# Patient Record
Sex: Male | Born: 1950 | Race: White | Hispanic: No | Marital: Married | Smoking: Former smoker
Health system: Southern US, Community
[De-identification: ages and names within clinical notes are randomized; demographics above are authoritative.]

## PROBLEM LIST (undated history)

## (undated) DIAGNOSIS — K5792 Diverticulitis of intestine, part unspecified, without perforation or abscess without bleeding: Secondary | ICD-10-CM

## (undated) DIAGNOSIS — E119 Type 2 diabetes mellitus without complications: Secondary | ICD-10-CM

## (undated) DIAGNOSIS — N400 Enlarged prostate without lower urinary tract symptoms: Secondary | ICD-10-CM

## (undated) DIAGNOSIS — S42401A Unspecified fracture of lower end of right humerus, initial encounter for closed fracture: Secondary | ICD-10-CM

## (undated) DIAGNOSIS — K5790 Diverticulosis of intestine, part unspecified, without perforation or abscess without bleeding: Secondary | ICD-10-CM

## (undated) DIAGNOSIS — Z5189 Encounter for other specified aftercare: Secondary | ICD-10-CM

## (undated) DIAGNOSIS — K635 Polyp of colon: Secondary | ICD-10-CM

## (undated) DIAGNOSIS — IMO0002 Reserved for concepts with insufficient information to code with codable children: Secondary | ICD-10-CM

## (undated) HISTORY — DX: Polyp of colon: K63.5

## (undated) HISTORY — DX: Encounter for other specified aftercare: Z51.89

## (undated) HISTORY — DX: Diverticulitis of intestine, part unspecified, without perforation or abscess without bleeding: K57.92

## (undated) HISTORY — DX: Reserved for concepts with insufficient information to code with codable children: IMO0002

## (undated) HISTORY — DX: Unspecified fracture of lower end of right humerus, initial encounter for closed fracture: S42.401A

## (undated) HISTORY — DX: Benign prostatic hyperplasia without lower urinary tract symptoms: N40.0

## (undated) HISTORY — DX: Diverticulosis of intestine, part unspecified, without perforation or abscess without bleeding: K57.90

---

## 1995-05-08 DIAGNOSIS — IMO0001 Reserved for inherently not codable concepts without codable children: Secondary | ICD-10-CM

## 1995-05-08 DIAGNOSIS — Z5189 Encounter for other specified aftercare: Secondary | ICD-10-CM

## 1995-05-08 DIAGNOSIS — IMO0002 Reserved for concepts with insufficient information to code with codable children: Secondary | ICD-10-CM

## 1995-05-08 HISTORY — DX: Reserved for concepts with insufficient information to code with codable children: IMO0002

## 1995-05-08 HISTORY — DX: Encounter for other specified aftercare: Z51.89

## 1995-05-08 HISTORY — DX: Reserved for inherently not codable concepts without codable children: IMO0001

## 1998-05-09 ENCOUNTER — Other Ambulatory Visit: Admission: RE | Admit: 1998-05-09 | Discharge: 1998-05-09 | Payer: Self-pay | Admitting: Otolaryngology

## 2011-06-08 DIAGNOSIS — K635 Polyp of colon: Secondary | ICD-10-CM

## 2011-06-08 HISTORY — DX: Polyp of colon: K63.5

## 2011-06-18 ENCOUNTER — Ambulatory Visit (AMBULATORY_SURGERY_CENTER): Payer: BC Managed Care – PPO | Admitting: *Deleted

## 2011-06-18 VITALS — Ht 69.0 in | Wt 210.0 lb

## 2011-06-18 DIAGNOSIS — Z1211 Encounter for screening for malignant neoplasm of colon: Secondary | ICD-10-CM

## 2011-06-18 MED ORDER — PEG-KCL-NACL-NASULF-NA ASC-C 100 G PO SOLR: ORAL | Status: DC

## 2011-06-28 ENCOUNTER — Encounter: Payer: Self-pay | Admitting: Gastroenterology

## 2011-06-28 ENCOUNTER — Ambulatory Visit (AMBULATORY_SURGERY_CENTER): Payer: BC Managed Care – PPO | Admitting: Gastroenterology

## 2011-06-28 DIAGNOSIS — Z1211 Encounter for screening for malignant neoplasm of colon: Secondary | ICD-10-CM

## 2011-06-28 DIAGNOSIS — D126 Benign neoplasm of colon, unspecified: Secondary | ICD-10-CM

## 2011-06-28 DIAGNOSIS — K5792 Diverticulitis of intestine, part unspecified, without perforation or abscess without bleeding: Secondary | ICD-10-CM

## 2011-06-28 MED ORDER — SODIUM CHLORIDE 0.9 % IV SOLN: 500.0000 mL | INTRAVENOUS | Status: DC

## 2011-06-28 MED ORDER — CIPROFLOXACIN HCL 500 MG PO TABS: 500.0000 mg | ORAL_TABLET | Freq: Two times a day (BID) | ORAL | Status: AC

## 2011-06-28 NOTE — Patient Instructions (Signed)
Per Dr. Russella Dar hold aspirin, aspirin products and anti-inflammatory for 2 weeks until July 12, 2011.  You may resume your other medications other than what was just mentioned.  There was some diverticulitis still noted so Dr. Russella Dar has ordered CIPRO 500mg  #14 take one 2 x per day for 7 days.  The RX was sent to [facility] on [address].  Please call if any questions or concerns. Handouts were given to your care partner on polyps, diverticulosis and high fiber diet with liberal fluid intake.  YOU HAD AN ENDOSCOPIC PROCEDURE TODAY AT THE [facility]: Refer to the procedure report that was given to you for any specific questions about what was found during the examination.  If the procedure report does not answer your questions, please call your gastroenterologist to clarify.  If you requested that your care partner not be given the details of your procedure findings, then the procedure report has been included in a sealed envelope for you to review at your convenience later.  YOU SHOULD EXPECT: Some feelings of bloating in the abdomen. Passage of more gas than usual.  Walking can help get rid of the air that was put into your GI tract during the procedure and reduce the bloating. If you had a lower endoscopy (such as a colonoscopy or flexible sigmoidoscopy) you may notice spotting of blood in your stool or on the toilet paper. If you underwent a bowel prep for your procedure, then you may not have a normal bowel movement for a few days.  DIET: Your first meal following the procedure should be a light meal and then it is ok to progress to your normal diet.  A half-sandwich or bowl of soup is an example of a good first meal.  Heavy or fried foods are harder to digest and may make you feel nauseous or bloated.  Likewise meals heavy in dairy and vegetables can cause extra gas to form and this can also increase the bloating.  Drink plenty of fluids but you should avoid alcoholic beverages for 24  hours.  ACTIVITY: Your care partner should take you home directly after the procedure.  You should plan to take it easy, moving slowly for the rest of the day.  You can resume normal activity the day after the procedure however you should NOT DRIVE or use heavy machinery for 24 hours (because of the sedation medicines used during the test).    SYMPTOMS TO REPORT IMMEDIATELY: A gastroenterologist can be reached at any hour.  During normal business hours, 8:30 AM to 5:00 PM Monday through Friday, call [phone].  After hours and on weekends, please call the GI answering service at [phone] who will take a message and have the physician on call contact you.   Following lower endoscopy (colonoscopy or flexible sigmoidoscopy):  Excessive amounts of blood in the stool  Significant tenderness or worsening of abdominal pains  Swelling of the abdomen that is new, acute  Fever of 100F or higher  Following upper endoscopy (EGD)  Vomiting of blood or coffee ground material  New chest pain or pain under the shoulder blades  Painful or persistently difficult swallowing  New shortness of breath  Fever of 100F or higher  Black, tarry-looking stools  FOLLOW UP: If any biopsies were taken you will be contacted by phone or by letter within the next 1-3 weeks.  Call your gastroenterologist if you have not heard about the biopsies in 3 weeks.  Our staff  will call the home number listed on your records the next business day following your procedure to check on you and address any questions or concerns that you may have at that time regarding the information given to you following your procedure. This is a courtesy call and so if there is no answer at the home number and we have not heard from you through the emergency physician on call, we will assume that you have returned to your regular daily activities without incident.  SIGNATURES/CONFIDENTIALITY: You and/or your care partner have signed  paperwork which will be entered into your electronic medical record.  These signatures attest to the fact that that the information above on your After Visit Summary has been reviewed and is understood.  Full responsibility of the confidentiality of this discharge information lies with you and/or your care-partner.

## 2011-06-28 NOTE — Progress Notes (Signed)
No complaints noted in the recovery room. Maw  Patient did not experience any of the following events: a burn prior to discharge; a fall within the facility; wrong site/side/patient/procedure/implant event; or a hospital transfer or hospital admission upon discharge from the facility. (G8907) Patient did not have preoperative order for IV antibiotic SSI prophylaxis. (G8918)  

## 2011-06-28 NOTE — Op Note (Signed)
[facility] [address] [place], [place]  [address]  COLONOSCOPY PROCEDURE REPORT  PATIENT:  Russell Soto, Russell Soto  MR#:  [id number] BIRTHDATE:  1950-11-21, 60 yrs. old  GENDER:  male ENDOSCOPIST:  Judie Petit T. Russella Dar, MD, [facility]  PROCEDURE DATE:  06/28/2011 PROCEDURE:  Colonoscopy with biopsy and snare polypectomy ASA CLASS:  Class II INDICATIONS:  1) Routine Risk Screening MEDICATIONS:   These medications were titrated to patient response per physician's verbal order, Fentanyl 100 mcg IV, Versed 10 mg IV DESCRIPTION OF PROCEDURE:   After the risks benefits and alternatives of the procedure were thoroughly explained, informed consent was obtained.  Digital rectal exam was performed and revealed no abnormalities.   The LB 180AL [id number] endoscope was introduced through the anus and advanced to the cecum, which was identified by both the appendix and ileocecal valve, without limitations.  The quality of the prep was good, using MoviPrep. The instrument was then slowly withdrawn as the colon was fully examined. <<PROCEDUREIMAGES>> FINDINGS:  A sessile polyp was found in the ascending colon. It was 4 mm in size. The polyp was removed using cold biopsy forceps. Seven polyps were found in the transverse colon. They were 5 - 7 mm in size. Polyps were snared without cautery. Retrieval was successful. A sessile polyp was found in the mid transverse colon. It was 4 mm in size. The polyp was removed using cold biopsy forceps.  Two polyps were found in the descending colon. They were 5 - 6 mm in size. Polyps were snared without cautery. Retrieval was successful. Moderate diverticulosis was found in the sigmoid to descending colon. Diverticulitis was found in the sigmoid colon. Otherwise normal colonoscopy without other polyps, masses, vascular ectasias, or inflammatory changes. Retroflexed views in the rectum revealed no abnormalities. The time to cecum =  1.25 minutes. The scope was then  withdrawn (time =  18.5  min) from the patient and the procedure completed.  COMPLICATIONS:  None  ENDOSCOPIC IMPRESSION: 1) 4 mm sessile polyp in the ascending colon 2) 5 - 7 mm Five polyps in the transverse colon 3) 4 mm sessile polyp in the mid transverse colon 4) 5 - 6 mm Two polyps in the descending colon 5) Moderate diverticulosis in the sigmoid to descending colon 6) Diverticulitis in the sigmoid colon  RECOMMENDATIONS: 1) Hold aspirin, aspirin products, and anti-inflammatory medication for 2 weeks. 2) Await pathology results 3) High fiber diet with liberal fluid intake. 4) Cipro 500 mg po bid, #14 5) Repeat Colonoscopy in 3 years if 3 or more polyps are adenomatous, 5 years if 1-2 are adenomatous, otherwise 10 years.  Venita Lick. Russella Dar, MD, [facility]  n. eSIGNED:   Venita Lick. Shaleka Brines at 06/28/2011 02:48 PM  Jettie Pagan, [phone]

## 2011-06-29 ENCOUNTER — Telehealth: Payer: Self-pay | Admitting: *Deleted

## 2011-06-29 NOTE — Telephone Encounter (Signed)
Left message on number given in admitting yesterday. ewm 

## 2011-07-03 ENCOUNTER — Encounter: Payer: Self-pay | Admitting: Gastroenterology

## 2012-03-24 ENCOUNTER — Telehealth: Payer: Self-pay | Admitting: Gastroenterology

## 2012-03-24 ENCOUNTER — Ambulatory Visit (INDEPENDENT_AMBULATORY_CARE_PROVIDER_SITE_OTHER): Payer: BC Managed Care – PPO | Admitting: Physician Assistant

## 2012-03-24 ENCOUNTER — Encounter: Payer: Self-pay | Admitting: *Deleted

## 2012-03-24 VITALS — BP 142/82 | HR 85 | Ht 68.0 in | Wt 214.4 lb

## 2012-03-24 DIAGNOSIS — K5790 Diverticulosis of intestine, part unspecified, without perforation or abscess without bleeding: Secondary | ICD-10-CM

## 2012-03-24 DIAGNOSIS — Z86010 Personal history of colonic polyps: Secondary | ICD-10-CM | POA: Insufficient documentation

## 2012-03-24 DIAGNOSIS — N400 Enlarged prostate without lower urinary tract symptoms: Secondary | ICD-10-CM

## 2012-03-24 DIAGNOSIS — Z860101 Personal history of adenomatous and serrated colon polyps: Secondary | ICD-10-CM

## 2012-03-24 DIAGNOSIS — K5730 Diverticulosis of large intestine without perforation or abscess without bleeding: Secondary | ICD-10-CM

## 2012-03-24 MED ORDER — METRONIDAZOLE 500 MG PO TABS: 500.0000 mg | ORAL_TABLET | Freq: Two times a day (BID) | ORAL | Status: AC

## 2012-03-24 MED ORDER — CIPROFLOXACIN HCL 500 MG PO TABS: 500.0000 mg | ORAL_TABLET | Freq: Two times a day (BID) | ORAL | Status: DC

## 2012-03-24 MED ORDER — CIPROFLOXACIN HCL 500 MG PO TABS: 500.0000 mg | ORAL_TABLET | Freq: Two times a day (BID) | ORAL | Status: AC

## 2012-03-24 MED ORDER — METRONIDAZOLE 500 MG PO TABS: 500.0000 mg | ORAL_TABLET | Freq: Two times a day (BID) | ORAL | Status: DC

## 2012-03-24 NOTE — Telephone Encounter (Signed)
Patient c/o LLQ pain that started last night.  It woke him up in the night.  He is very tender to touch this am on the LLQ.  He tried a heating pad and tylenol with no improvement.  He had a colon 06/2011 and was treated at the time for diverticulitis.  He will come in today and see Amy Esterwood PA at 2:00

## 2012-03-24 NOTE — Patient Instructions (Addendum)
Eat small frequent soft foods. Call if symptoms have not resolved or if they worsen at any point. We sent prescriptions to [facility] Dr for Cipro and Metronidazole, antibiotics. Take Extra strength Tylenol as needed for pain.

## 2012-03-24 NOTE — Progress Notes (Signed)
Subjective:    Patient ID: Russell Soto, male    DOB: [birth year], 60 y.o.   MRN: [id number]  HPI Russell Soto is a pleasant 61 year old male known to Dr. Russella Dar who has history of adenomatous colon polyps and diverticular disease. He last had colonoscopy in February of 2013 and was found to have active diverticulitis sigmoid colon, and diverticulosis of the sigmoid and descending colon. He also had multiple polyps with 9 polyps removed in total. The largest of these was 5-7 mm in the transverse colon, and 3 of the polyps were tubular adenomas negative for high-grade dysplasia. He was to a course of Cipro after the colonoscopy and is planned for a followup colon and a 3 year interval.  He states he has done well since that time, and has been trying to watch his diet, avoid nuts etc. He had did eat a lot of popcorn this past Friday evening and then woke up early this morning with left-sided abdominal pain which woke him from sleep.  He says the pain has been constant and he is uncomfortable with walking. He has not had any changes in his bowel habits, melena or hematochezia. No nausea vomiting fever or chills. He says he just does not feel well and that is unusual for him. He denies any dysuria, frequency etc.    Review of Systems  Constitutional: Positive for fatigue.  HENT: Negative.   Eyes: Negative.   Respiratory: Negative.   Cardiovascular: Negative.   Gastrointestinal: Positive for abdominal pain.  Genitourinary: Negative.   Musculoskeletal: Negative.   Neurological: Negative.   Hematological: Negative.   Psychiatric/Behavioral: Negative.    Outpatient Prescriptions Prior to Visit  Medication Sig Dispense Refill  . Ascorbic Acid (VITAMIN C) 500 MG CAPS Take 1 capsule by mouth daily.      Marland Kitchen aspirin 325 MG EC tablet Take 325 mg by mouth daily.      . fish oil-omega-3 fatty acids 1000 MG capsule Take 1 g by mouth daily.      . Multiple Vitamins-Minerals (MULTIVITAMIN WITH MINERALS) tablet  Take 1 tablet by mouth daily.      . Tamsulosin HCl (FLOMAX) 0.4 MG CAPS Take 1 capsule by mouth daily after supper.          No Known Allergies Patient Active Problem List  Diagnosis  . Diverticulosis  . Hx of adenomatous colonic polyps  . BPH (benign prostatic hyperplasia)   History  Substance Use Topics  . Smoking status: Former Smoker    Quit date: 12/20/1994  . Smokeless tobacco: Never Used  . Alcohol Use: No    Objective:   Physical Exam well-developed older white male in no acute distress, pleasant blood pressure 142/82 pulse 85 height 5 foot 8 weight 214. HEENT; nontraumatic normocephalic EOMI PERRLA sclera anicteric, Neck;supple no JVD, Cardiovascular; regular rate and rhythm with S1-S2 no murmur or gallop, Pulmonary; clear bilaterally, Abdomen ;soft, bowel sounds are present he is tender in the left mid quadrant and left lower quadrant no guarding or rebound, no palpable mass or hepatosplenomegaly, Rectal; exam not done, Extremities; no clubbing cyanosis or edema skin warm and dry, Psych; mood and affect normal and appropriate.        Assessment & Plan:  #18 61 year old male with recurrent acute diverticulitis. #2 multiple adenomatous polyps, as well as hyperplastic polyps found on colonoscopy February 2013. He is to have a followup in 3 years  Plan; start Cipro 500 mg by mouth twice daily x14 days Start Flagyl  500 mg by mouth twice daily x14 days with food Offered an analgesic whichhe declines-will use Tylenol as needed He is advised pushing fluids and eat a soft bland diet over the next few days with gradual advancement. He will call if his pain or other symptoms worsen at any point over the next week, And also knows to call if his pain has not completely resolved when he finishes the antibiotics. Continue avoidance of popcorn and nuts.

## 2012-03-24 NOTE — Progress Notes (Signed)
Reviewed and agree with management plan. Kashmere Daywalt T. Geralynn Capri MD FACG 

## 2012-06-20 ENCOUNTER — Emergency Department (HOSPITAL_COMMUNITY)
Admission: EM | Admit: 2012-06-20 | Discharge: 2012-06-20 | Disposition: A | Discharge status: Home / Self Care | Payer: BC Managed Care – PPO | Attending: Emergency Medicine | Admitting: Emergency Medicine

## 2012-06-20 ENCOUNTER — Emergency Department (HOSPITAL_COMMUNITY): Payer: BC Managed Care – PPO

## 2012-06-20 ENCOUNTER — Encounter (HOSPITAL_COMMUNITY): Payer: Self-pay | Admitting: Emergency Medicine

## 2012-06-20 DIAGNOSIS — Z7982 Long term (current) use of aspirin: Secondary | ICD-10-CM | POA: Insufficient documentation

## 2012-06-20 DIAGNOSIS — Z860100 Personal history of colon polyps, unspecified: Secondary | ICD-10-CM | POA: Insufficient documentation

## 2012-06-20 DIAGNOSIS — S0181XA Laceration without foreign body of other part of head, initial encounter: Secondary | ICD-10-CM

## 2012-06-20 DIAGNOSIS — Y9389 Activity, other specified: Secondary | ICD-10-CM | POA: Insufficient documentation

## 2012-06-20 DIAGNOSIS — Z87891 Personal history of nicotine dependence: Secondary | ICD-10-CM | POA: Insufficient documentation

## 2012-06-20 DIAGNOSIS — Z8719 Personal history of other diseases of the digestive system: Secondary | ICD-10-CM | POA: Insufficient documentation

## 2012-06-20 DIAGNOSIS — Y92410 Unspecified street and highway as the place of occurrence of the external cause: Secondary | ICD-10-CM | POA: Insufficient documentation

## 2012-06-20 DIAGNOSIS — Z23 Encounter for immunization: Secondary | ICD-10-CM | POA: Insufficient documentation

## 2012-06-20 DIAGNOSIS — W19XXXA Unspecified fall, initial encounter: Secondary | ICD-10-CM

## 2012-06-20 DIAGNOSIS — Z8711 Personal history of peptic ulcer disease: Secondary | ICD-10-CM | POA: Insufficient documentation

## 2012-06-20 DIAGNOSIS — S0180XA Unspecified open wound of other part of head, initial encounter: Secondary | ICD-10-CM | POA: Insufficient documentation

## 2012-06-20 DIAGNOSIS — Z79899 Other long term (current) drug therapy: Secondary | ICD-10-CM | POA: Insufficient documentation

## 2012-06-20 DIAGNOSIS — W1809XA Striking against other object with subsequent fall, initial encounter: Secondary | ICD-10-CM | POA: Insufficient documentation

## 2012-06-20 DIAGNOSIS — N400 Enlarged prostate without lower urinary tract symptoms: Secondary | ICD-10-CM | POA: Insufficient documentation

## 2012-06-20 MED ORDER — TETANUS-DIPHTH-ACELL PERTUSSIS 5-2.5-18.5 LF-MCG/0.5 IM SUSP
0.5000 mL | Freq: Once | INTRAMUSCULAR | Status: AC
  Administered 2012-06-20: 0.5 mL via INTRAMUSCULAR
  Filled 2012-06-20: qty 0.5
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
## 2012-06-20 NOTE — ED Notes (Signed)
Suture cart at bedside 
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
## 2012-06-20 NOTE — ED Notes (Signed)
[id number]<AV> Expected date:<BR> Expected time:<BR> Means of arrival:<BR> Comments:<BR> For triage 2

## 2012-10-14 ENCOUNTER — Telehealth: Payer: Self-pay | Admitting: Gastroenterology

## 2012-10-14 NOTE — Telephone Encounter (Signed)
Patient c/o LLQ pain that started yesterday.  He feels these are the same symptoms he had when he was diagnosed with diverticulitis.  He is advised to start on a clear liquid diet and come in and see Willette Cluster RNP tomorrow at 1:30

## 2012-10-15 ENCOUNTER — Encounter: Payer: Self-pay | Admitting: Nurse Practitioner

## 2012-10-15 ENCOUNTER — Ambulatory Visit (INDEPENDENT_AMBULATORY_CARE_PROVIDER_SITE_OTHER): Payer: BC Managed Care – PPO | Admitting: Nurse Practitioner

## 2012-10-15 VITALS — BP 132/80 | HR 64 | Ht 67.75 in | Wt 213.2 lb

## 2012-10-15 DIAGNOSIS — K5732 Diverticulitis of large intestine without perforation or abscess without bleeding: Secondary | ICD-10-CM

## 2012-10-15 MED ORDER — METRONIDAZOLE 500 MG PO TABS: 500.0000 mg | ORAL_TABLET | Freq: Two times a day (BID) | ORAL | Status: AC

## 2012-10-15 MED ORDER — CIPROFLOXACIN HCL 500 MG PO TABS: 500.0000 mg | ORAL_TABLET | Freq: Two times a day (BID) | ORAL | Status: AC

## 2012-10-15 NOTE — Progress Notes (Signed)
  History of Present Illness:  Patient is a 62 year old male known to Dr. Russella Dar. He has a history of adenomatous colon polyps and diverticular disease. His colonoscopy in February 2013 revealed active sigmoid diverticulitis. Multiple polyps were removed, pathology compatible with tubular adenomas. Followup colonoscopy recommended at 3 year mark  Patient was last seen in mid November 2013 at which time he was treated for recurrent diverticulitis. He is worked in today for evaluation of abdominal pain. Patient worked out his upper body Friday at the gym. A couple of days later he developed mid abdominal discomfort thought to be from vigorous exercise. Later that evening pain localized to left lower quadrant, it was reminiscent of diverticulitis. Patient had an old prescription of Cipro which he started in addition to clear liquids and Tylenol.  Pain improved but has recurred. Patient's wife told him he was sweating in his sleep. No chills. Bowel movements are normal. No urinary symptoms.  Current Medications, Allergies, Past Medical History, Past Surgical History, Family History and Social History were reviewed in [employer] record. Physical Exam: General: Well developed , white male in no acute distress Head: Normocephalic and atraumatic Eyes:  sclerae anicteric, conjunctiva pink  Ears: Normal auditory acuity Lungs: Clear throughout to auscultation Heart: Regular rate and rhythm Abdomen: Soft, non distended, moderate left lower quadrant tenderness. Negative Carnett's sign. No masses, no hepatomegaly. Normal bowel sounds Musculoskeletal: Symmetrical with no gross deformities  Extremities: No edema  Neurological: Alert oriented x 4, grossly nonfocal Psychological:  Alert and cooperative. Normal mood and affect  Assessment and Recommendations: Left lower quadrant pain, suspect recurrent diverticulitis. Patient looks okay, he is tender in left lower quadrant. Will treat with  ten day course of Cipro and Flagyl. Continue clear liquids until feeling better then advance to low fiber diet. After a couple of weeks patient should resume high-fiber diet. Return to clinic in 3-4 weeks for recheck. Call us in the interim if symptoms worsen. This is likely patient's third episode of diverticulitis, at some point he may need surgical evaluation.

## 2012-10-15 NOTE — Patient Instructions (Addendum)
We sent the prescription for the Cipro and Flagyl to [facility] [address]. Stay on full liquids and then when feeling better advance to low fiber diet and after 2 weeks go to high fiber diet. Call us if symptoms worsen.   We made you a follow up appointment with Dr. Russella Dar on 11-25-2012 at 2:15 PM.  Call us at [phone] if you need to change the appointment.  Be sure to do it at least 2 days ahead of time.

## 2012-10-20 NOTE — Progress Notes (Signed)
Reviewed and agree with management. Orry Sigl D. Marthe Dant, M.D., FACG  

## 2012-11-17 ENCOUNTER — Encounter: Payer: Self-pay | Admitting: Internal Medicine

## 2012-11-25 ENCOUNTER — Ambulatory Visit (INDEPENDENT_AMBULATORY_CARE_PROVIDER_SITE_OTHER): Payer: BC Managed Care – PPO | Admitting: Gastroenterology

## 2012-11-25 ENCOUNTER — Encounter: Payer: Self-pay | Admitting: Gastroenterology

## 2012-11-25 VITALS — BP 150/80 | HR 80 | Ht 68.0 in | Wt 217.2 lb

## 2012-11-25 DIAGNOSIS — Z86010 Personal history of colonic polyps: Secondary | ICD-10-CM

## 2012-11-25 DIAGNOSIS — K5732 Diverticulitis of large intestine without perforation or abscess without bleeding: Secondary | ICD-10-CM

## 2012-11-25 NOTE — Patient Instructions (Addendum)
High-Fiber Diet Fiber is found in fruits, vegetables, and grains. A high-fiber diet encourages the addition of more whole grains, legumes, fruits, and vegetables in your diet. The recommended amount of fiber for adult males is 38 g per day. For adult females, it is 25 g per day. Pregnant and lactating women should get 28 g of fiber per day. If you have a digestive or bowel problem, ask your caregiver for advice before adding high-fiber foods to your diet. Eat a variety of high-fiber foods instead of only a select few type of foods.  PURPOSE  To increase stool bulk.  To make bowel movements more regular to prevent constipation.  To lower cholesterol.  To prevent overeating. WHEN IS THIS DIET USED?  It may be used if you have constipation and hemorrhoids.  It may be used if you have uncomplicated diverticulosis (intestine condition) and irritable bowel syndrome.  It may be used if you need help with weight management.  It may be used if you want to add it to your diet as a protective measure against atherosclerosis, diabetes, and cancer. SOURCES OF FIBER  Whole-grain breads and cereals.  Fruits, such as apples, oranges, bananas, berries, prunes, and pears.  Vegetables, such as green peas, carrots, sweet potatoes, beets, broccoli, cabbage, spinach, and artichokes.  Legumes, such split peas, soy, lentils.  Almonds. FIBER CONTENT IN FOODS Starches and Grains / Dietary Fiber (g)  Cheerios, 1 cup / 3 g  Corn Flakes cereal, 1 cup / 0.7 g  Rice crispy treat cereal, 1 cup / 0.3 g  Instant oatmeal (cooked),  cup / 2 g  Frosted wheat cereal, 1 cup / 5.1 g  Brown, long-grain rice (cooked), 1 cup / 3.5 g  White, long-grain rice (cooked), 1 cup / 0.6 g  Enriched macaroni (cooked), 1 cup / 2.5 g Legumes / Dietary Fiber (g)  Baked beans (canned, plain, or vegetarian),  cup / 5.2 g  Kidney beans (canned),  cup / 6.8 g  Pinto beans (cooked),  cup / 5.5 g Breads and Crackers  / Dietary Fiber (g)  Plain or honey graham crackers, 2 squares / 0.7 g  Saltine crackers, 3 squares / 0.3 g  Plain, salted pretzels, 10 pieces / 1.8 g  Whole-wheat bread, 1 slice / 1.9 g  White bread, 1 slice / 0.7 g  Raisin bread, 1 slice / 1.2 g  Plain bagel, 3 oz / 2 g  Flour tortilla, 1 oz / 0.9 g  Corn tortilla, 1 small / 1.5 g  Hamburger or hotdog bun, 1 small / 0.9 g Fruits / Dietary Fiber (g)  Apple with skin, 1 medium / 4.4 g  Sweetened applesauce,  cup / 1.5 g  Banana,  medium / 1.5 g  Grapes, 10 grapes / 0.4 g  Orange, 1 small / 2.3 g  Raisin, 1.5 oz / 1.6 g  Melon, 1 cup / 1.4 g Vegetables / Dietary Fiber (g)  Green beans (canned),  cup / 1.3 g  Carrots (cooked),  cup / 2.3 g  Broccoli (cooked),  cup / 2.8 g  Peas (cooked),  cup / 4.4 g  Mashed potatoes,  cup / 1.6 g  Lettuce, 1 cup / 0.5 g  Corn (canned),  cup / 1.6 g  Tomato,  cup / 1.1 g Document Released: 04/23/2005 Document Revised: 10/23/2011 Document Reviewed: 07/26/2011 [facility] Patient Information 2014 [place], [place].   Thank you for choosing me and [facility].  Venita Lick. Pleas Koch., MD.,  FACG  cc: Geoffry Paradise, MD

## 2012-11-25 NOTE — Progress Notes (Signed)
History of Present Illness: This is a 62 year old male returning for followup for diverticulitis. He completed a course of antibiotics and his symptoms have completely resolved. He underwent colonoscopy in February 2013 with multiple small adenomatous colon polyps diverticulosis and focal diverticulitis. His recent episode was his third episode of diverticulitis. All episodes have responded promptly to oral antibiotics.  Current Medications, Allergies, Past Medical History, Past Surgical History, Family History and Social History were reviewed in [employer] record.  Physical Exam: General: Well developed , well nourished, no acute distress Head: Normocephalic and atraumatic Eyes:  sclerae anicteric, EOMI Ears: Normal auditory acuity Mouth: No deformity or lesions Lungs: Clear throughout to auscultation Heart: Regular rate and rhythm; no murmurs, rubs or bruits Abdomen: Soft, non tender and non distended. No masses, hepatosplenomegaly or hernias noted. Normal Bowel sounds Musculoskeletal: Symmetrical with no gross deformities  Pulses:  Normal pulses noted Extremities: No clubbing, cyanosis, edema or deformities noted Neurological: Alert oriented x 4, grossly nonfocal Psychological:  Alert and cooperative. Normal mood and affect  Assessment and Recommendations:  1. Recurrent diverticulitis. Advised to follow high fiber diet with adequate daily water intake long-term. Although medical literature does not show a clear benefit to avoidance of seeds and nuts, it is reasonable to minimize these foods and if he can. Trial of a daily probiotic for 6-12 months and if he has fewer episodes of diverticulitis during this time I would consider long-term probiotic usage.  2. Personal history of multiple adenomatous colon polyps. 3 year surveillance interval recommended with colonoscopy due in February 2016.

## 2013-02-12 ENCOUNTER — Telehealth: Payer: Self-pay | Admitting: Gastroenterology

## 2013-02-12 MED ORDER — CIPROFLOXACIN HCL 500 MG PO TABS: 500.0000 mg | ORAL_TABLET | Freq: Two times a day (BID) | ORAL | Status: DC

## 2013-02-12 MED ORDER — METRONIDAZOLE 500 MG PO TABS: 500.0000 mg | ORAL_TABLET | Freq: Two times a day (BID) | ORAL | Status: DC

## 2013-02-12 NOTE — Telephone Encounter (Signed)
Patient reports LLQ pain that started again last night.  He denies fever, constipation, diarrhea, rectal bleeding or other complaints.  He is tender to the touch on the left lower side.  He has tramadol at home for pain.  He was recently treated for diverticulitis and responded well to a course of antibiotics.  Willette Cluster RNP can we call in another round of antibiotics?

## 2013-02-12 NOTE — Telephone Encounter (Signed)
Discussed with Willette Cluster RNP cipro and flagyl 500 mg BID for 14 days each.  He sis advised to be on a clear liquid diet and advance his diet as tolerated.  He will come for a follow up on 03/13/13 with Dr. Russella Dar

## 2013-03-13 ENCOUNTER — Ambulatory Visit (INDEPENDENT_AMBULATORY_CARE_PROVIDER_SITE_OTHER): Payer: BC Managed Care – PPO | Admitting: Gastroenterology

## 2013-03-13 ENCOUNTER — Encounter: Payer: Self-pay | Admitting: Gastroenterology

## 2013-03-13 VITALS — BP 142/86 | HR 82 | Ht 68.0 in | Wt 216.0 lb

## 2013-03-13 DIAGNOSIS — K5732 Diverticulitis of large intestine without perforation or abscess without bleeding: Secondary | ICD-10-CM

## 2013-03-13 NOTE — Patient Instructions (Signed)
High-Fiber Diet Fiber is found in fruits, vegetables, and grains. A high-fiber diet encourages the addition of more whole grains, legumes, fruits, and vegetables in your diet. The recommended amount of fiber for adult males is 38 g per day. For adult females, it is 25 g per day. Pregnant and lactating women should get 28 g of fiber per day. If you have a digestive or bowel problem, ask your caregiver for advice before adding high-fiber foods to your diet. Eat a variety of high-fiber foods instead of only a select few type of foods.  PURPOSE  To increase stool bulk.  To make bowel movements more regular to prevent constipation.  To lower cholesterol.  To prevent overeating. WHEN IS THIS DIET USED?  It may be used if you have constipation and hemorrhoids.  It may be used if you have uncomplicated diverticulosis (intestine condition) and irritable bowel syndrome.  It may be used if you need help with weight management.  It may be used if you want to add it to your diet as a protective measure against atherosclerosis, diabetes, and cancer. SOURCES OF FIBER  Whole-grain breads and cereals.  Fruits, such as apples, oranges, bananas, berries, prunes, and pears.  Vegetables, such as green peas, carrots, sweet potatoes, beets, broccoli, cabbage, spinach, and artichokes.  Legumes, such split peas, soy, lentils.  Almonds. FIBER CONTENT IN FOODS Starches and Grains / Dietary Fiber (g)  Cheerios, 1 cup / 3 g  Corn Flakes cereal, 1 cup / 0.7 g  Rice crispy treat cereal, 1 cup / 0.3 g  Instant oatmeal (cooked),  cup / 2 g  Frosted wheat cereal, 1 cup / 5.1 g  Brown, long-grain rice (cooked), 1 cup / 3.5 g  White, long-grain rice (cooked), 1 cup / 0.6 g  Enriched macaroni (cooked), 1 cup / 2.5 g Legumes / Dietary Fiber (g)  Baked beans (canned, plain, or vegetarian),  cup / 5.2 g  Kidney beans (canned),  cup / 6.8 g  Pinto beans (cooked),  cup / 5.5 g Breads and Crackers  / Dietary Fiber (g)  Plain or honey graham crackers, 2 squares / 0.7 g  Saltine crackers, 3 squares / 0.3 g  Plain, salted pretzels, 10 pieces / 1.8 g  Whole-wheat bread, 1 slice / 1.9 g  White bread, 1 slice / 0.7 g  Raisin bread, 1 slice / 1.2 g  Plain bagel, 3 oz / 2 g  Flour tortilla, 1 oz / 0.9 g  Corn tortilla, 1 small / 1.5 g  Hamburger or hotdog bun, 1 small / 0.9 g Fruits / Dietary Fiber (g)  Apple with skin, 1 medium / 4.4 g  Sweetened applesauce,  cup / 1.5 g  Banana,  medium / 1.5 g  Grapes, 10 grapes / 0.4 g  Orange, 1 small / 2.3 g  Raisin, 1.5 oz / 1.6 g  Melon, 1 cup / 1.4 g Vegetables / Dietary Fiber (g)  Green beans (canned),  cup / 1.3 g  Carrots (cooked),  cup / 2.3 g  Broccoli (cooked),  cup / 2.8 g  Peas (cooked),  cup / 4.4 g  Mashed potatoes,  cup / 1.6 g  Lettuce, 1 cup / 0.5 g  Corn (canned),  cup / 1.6 g  Tomato,  cup / 1.1 g Document Released: 04/23/2005 Document Revised: 10/23/2011 Document Reviewed: 07/26/2011 [facility] Patient Information 2014 [place], [place].  Thank you for choosing me and [facility].  Venita Lick. Pleas Koch., MD., Clementeen Graham

## 2013-03-13 NOTE — Progress Notes (Signed)
    History of Present Illness: This is a 62 year old male with recurrent diverticulitis returning for followup. He was last treated with a 14 day course of Cipro and Flagyl with complete resolution of the symptoms and he has done well now for several months. He is following a high fiber diet. He has no gastrointestinal complaints.  Current Medications, Allergies, Past Medical History, Past Surgical History, Family History and Social History were reviewed in [employer] record.  Physical Exam: General: Well developed , well nourished, no acute distress Head: Normocephalic and atraumatic Eyes:  sclerae anicteric, EOMI Ears: Normal auditory acuity Mouth: No deformity or lesions Lungs: Clear throughout to auscultation Heart: Regular rate and rhythm; no murmurs, rubs or bruits Abdomen: Soft, non tender and non distended. No masses, hepatosplenomegaly or hernias noted. Normal Bowel sounds Musculoskeletal: Symmetrical with no gross deformities  Pulses:  Normal pulses noted Extremities: No clubbing, cyanosis, edema or deformities noted Neurological: Alert oriented x 4, grossly nonfocal Psychological:  Alert and cooperative. Normal mood and affect  Assessment and Recommendations:  1. Recurrent diverticulitis. 3 episodes. Maintain a high fiber diet and adequate fluid intake long-term. Advised to attempt to have 25-30 g of fiber daily. He has recurrent flare will plan for 14 days of antibiotics.

## 2013-03-30 ENCOUNTER — Encounter: Payer: Self-pay | Admitting: Nurse Practitioner

## 2013-03-30 ENCOUNTER — Ambulatory Visit (INDEPENDENT_AMBULATORY_CARE_PROVIDER_SITE_OTHER): Payer: BC Managed Care – PPO | Admitting: Nurse Practitioner

## 2013-03-30 ENCOUNTER — Telehealth: Payer: Self-pay | Admitting: Gastroenterology

## 2013-03-30 VITALS — BP 156/80 | HR 78 | Ht 68.0 in | Wt 212.0 lb

## 2013-03-30 DIAGNOSIS — K5732 Diverticulitis of large intestine without perforation or abscess without bleeding: Secondary | ICD-10-CM

## 2013-03-30 MED ORDER — METRONIDAZOLE 250 MG PO TABS: 500.0000 mg | ORAL_TABLET | Freq: Three times a day (TID) | ORAL | Status: DC

## 2013-03-30 MED ORDER — CIPROFLOXACIN HCL 500 MG PO TABS: 500.0000 mg | ORAL_TABLET | Freq: Two times a day (BID) | ORAL | Status: DC

## 2013-03-30 NOTE — Telephone Encounter (Signed)
Patient with LLQ pain.  He was recently seen earlier in the month with resolved diverticulitis.  He reports his symptoms started yesterday.  They are "not as bad as it was before, but I can feel it".  He will come in today and see Willette Cluster RNP at 10:00

## 2013-03-30 NOTE — Patient Instructions (Addendum)
You have been scheduled for an appointment with Dr Carolynne Edouard at [facility]. Your appointment is on 04/10/13 at 300 pm. Please arrive at 230 pm for registration. Make certain to bring a list of current medications, including any over the counter medications or vitamins. Also bring your co-pay if you have one as well as your insurance cards. [facility] is located at [address], [address]. Should you need to reschedule your appointment, please contact them at [phone]. Low fiber diet until all done with antibiotics We have sent medications to your pharmacy for you to pick up at your convenience. CC: Geoffry Paradise MD

## 2013-03-31 ENCOUNTER — Encounter: Payer: Self-pay | Admitting: Nurse Practitioner

## 2013-03-31 NOTE — Progress Notes (Signed)
Reviewed and agree with management plan.  Ashtan Laton T. Shelbee Apgar, MD FACG 

## 2013-03-31 NOTE — Progress Notes (Signed)
     History of Present Illness:  Patient is a 62 year old male known to Dr. Russella Dar for a history of adenomatous colon polyps and recurrent diverticulitis. Patient was seen earlier this month in followup after a 14 day course of antibiotics for recurrent diverticulitis. He is back in today with recurrent left lower quadrant pain over the last few days. Pain reminiscent of diverticulitis. No fevers. No bowel changes. No urinary symptoms.  Current Medications, Allergies, Past Medical History, Past Surgical History, Family History and Social History were reviewed in [employer] record.  Physical Exam: General: Pleasant, well developed , white male in no acute distress Head: Normocephalic and atraumatic Eyes:  sclerae anicteric, conjunctiva pink  Ears: Normal auditory acuity Lungs: Clear throughout to auscultation Heart: Regular rate and rhythm Abdomen: Soft, non distended, mild to moderate left lower quadrant and mid lower abdominal tenderness. No hepatomegaly. Normal bowel sounds Musculoskeletal: Symmetrical with no gross deformities  Extremities: No edema  Neurological: Alert oriented x 4, grossly nonfocal Psychological:  Alert and cooperative. Normal mood and affect  Assessment and Recommendations: 62 year old male with left lower quadrant pain, reminiscent of diverticulitis. Patient has a history of documented diverticulitis (colonoscopy showed sigmoid diverticulitis in 2013). Patient just completed a two-week course of antibiotics less than a month ago with resolution of symptoms. This is his fifth episode of diverticulitis in less than two years. Will give him another two-week course of Cipro and Flagyl. A surgical evaluation isn't unreasonable at this point. Will refer him to [facility]. In the meantime recommend low fiber diet until feeling better. Patient knows to call us back ASAP for fevers or worsening abdominal pain.

## 2013-04-06 ENCOUNTER — Telehealth: Payer: Self-pay | Admitting: Gastroenterology

## 2013-04-06 NOTE — Telephone Encounter (Signed)
Left a message for patient to return my call. 

## 2013-04-06 NOTE — Telephone Encounter (Addendum)
Patient states he was told by his wife to call. He states that he started feeling nauseated after taking his Flagyl which I told him was normal. Patient states he then started taking one tablet by mouth three times a day instead of 2 tablets which made the nausea better. Asked patient if he has had any recurrent abdominal pain, fever or any symptoms since starting the antibiotics. Patient states no and that he is feeling much better but cannot take 2 tablets three times a day of the Flagyl. Told patient to make sure he has taken all of his antibiotics and to call us if he has any recurrent abd pain or any other GI symptoms. Also told pateint to keep appt with [facility]. Pt agreed.

## 2013-04-10 ENCOUNTER — Encounter (INDEPENDENT_AMBULATORY_CARE_PROVIDER_SITE_OTHER): Payer: Self-pay | Admitting: General Surgery

## 2013-04-10 ENCOUNTER — Ambulatory Visit (INDEPENDENT_AMBULATORY_CARE_PROVIDER_SITE_OTHER): Payer: BC Managed Care – PPO | Admitting: General Surgery

## 2013-04-10 VITALS — BP 148/86 | HR 68 | Temp 97.1°F | Resp 18 | Ht 68.0 in | Wt 214.0 lb

## 2013-04-10 DIAGNOSIS — K5732 Diverticulitis of large intestine without perforation or abscess without bleeding: Secondary | ICD-10-CM

## 2013-04-10 NOTE — Patient Instructions (Signed)
Take cipro and flagyl if you have another flare up and call us immediately

## 2013-04-16 NOTE — Progress Notes (Signed)
Patient ID: Russell Soto, male   DOB: February 15, 1951, 62 y.o.   MRN: [id number]  Chief Complaint  Patient presents with  . Diverticulitis    HPI Russell Soto is a 62 y.o. male.  We are asked to see the patient in consultation by Dr. Jacky Kindle to evaluate him for diverticulitis. The patient is a 62 year old white male who first began having abdominal pain and diarrhea in January 2013. At about that time he also had a colonoscopy done that showed some mild changes consistent with diverticulitis in the sigmoid colon. He was treated with antibiotics and got better. Since that time he has had a couple more episodes of left lower quadrant pain he has not had diarrhea. He has not noticed any blood in the stool. When he gets this pain he is treated with Cipro Flagyl and usually the pain resolves. He has never had any CT scan evaluation of his abdomen. He denies any fevers or chills. The most recent episode was approximately 2 weeks ago.  HPI  Past Medical History  Diagnosis Date  . BPH (benign prostatic hyperplasia)   . Ulcer 1997    gastric/bleeding  . Blood transfusion 1997  . Colon polyps 06/2011    Tubular adenomas polyps  . Diverticulitis   . Diverticulosis   . Elbow fracture, right     History reviewed. No pertinent past surgical history.  Family History  Problem Relation Age of Onset  . Diabetes Mother   . Stroke Mother     Social History History  Substance Use Topics  . Smoking status: Former Smoker    Quit date: 12/20/1994  . Smokeless tobacco: Never Used  . Alcohol Use: No    No Known Allergies  Current Outpatient Prescriptions  Medication Sig Dispense Refill  . Ascorbic Acid (VITAMIN C) 500 MG CAPS Take 1 capsule by mouth daily.      Marland Kitchen aspirin 325 MG EC tablet Take 325 mg by mouth daily.      . ciprofloxacin (CIPRO) 500 MG tablet Take 1 tablet (500 mg total) by mouth 2 (two) times daily.  42 tablet  0  . Cyanocobalamin (VITAMIN B 12 PO) Take 1 tablet by mouth daily.      .  fish oil-omega-3 fatty acids 1000 MG capsule Take 1 g by mouth daily.      . Flaxseed, Linseed, (FLAX SEED OIL PO) Take 1 tablet by mouth daily.      . Ginkgo Biloba (GINKOBA PO) Take 2 tablets by mouth daily.      . metroNIDAZOLE (FLAGYL) 250 MG tablet Take 2 tablets (500 mg total) by mouth 3 (three) times daily.  70 tablet  0  . Multiple Vitamins-Minerals (MULTIVITAMIN WITH MINERALS) tablet Take 1 tablet by mouth daily.      . Red Yeast Rice 600 MG TABS Take 1 tablet by mouth daily.      . Tamsulosin HCl (FLOMAX) 0.4 MG CAPS Take 1 capsule by mouth daily after supper.       No current facility-administered medications for this visit.    Review of Systems Review of Systems  Constitutional: Negative.   HENT: Negative.   Eyes: Negative.   Respiratory: Negative.   Cardiovascular: Negative.   Gastrointestinal: Positive for abdominal pain. Negative for nausea, vomiting and blood in stool.  Endocrine: Negative.   Genitourinary: Negative.   Musculoskeletal: Negative.   Skin: Negative.   Allergic/Immunologic: Negative.   Neurological: Negative.   Hematological: Negative.   Psychiatric/Behavioral: Negative.  Blood pressure 148/86, pulse 68, temperature 97.1 F (36.2 C), temperature source Temporal, resp. rate 18, height 5\' 8"  (1.727 m), weight 214 lb (97.07 kg).  Physical Exam Physical Exam  Constitutional: He is oriented to person, place, and time. He appears well-developed and well-nourished.  HENT:  Head: Normocephalic and atraumatic.  Eyes: Conjunctivae and EOM are normal. Pupils are equal, round, and reactive to light.  Neck: Normal range of motion. Neck supple.  Cardiovascular: Normal rate, regular rhythm and normal heart sounds.   Pulmonary/Chest: Effort normal and breath sounds normal.  Abdominal: Soft. Bowel sounds are normal. He exhibits no mass.  There is minimal tenderness in the left lower quadrant. There is no palpable mass. There is no guarding or peritonitis.   Musculoskeletal: Normal range of motion.  Neurological: He is alert and oriented to person, place, and time.  Skin: Skin is dry.  Psychiatric: He has a normal mood and affect. His behavior is normal.    Data Reviewed As above  Assessment    The patient has had several recurrent episodes of left lower quadrant pain that have been treated as if they were diverticulitis and improved. He has had only one documented colonoscopy that showed some very mild inflammation of the sigmoid colon early in 2013.     Plan    At this point he seems to be almost asymptomatic after a course of antibiotics. At this point I do not see any documented complicated nature of his diverticulitis. I've talked to him for a while about diet modification including avoidance of seeds and nuts as well as the addition of probiotics especially when taking antibiotics. I think it would be reasonable to continue to follow him closely  And scanned him if his symptoms recur. If he develops left lower quadrant pain I am okay with him starting Cipro and Flagyl again as long as he contact us immediately.        TOTH III,Reg Bircher S 04/16/2013, 8:43 AM

## 2013-08-03 ENCOUNTER — Other Ambulatory Visit (INDEPENDENT_AMBULATORY_CARE_PROVIDER_SITE_OTHER): Payer: Self-pay

## 2013-08-03 ENCOUNTER — Ambulatory Visit (HOSPITAL_COMMUNITY)
Admission: RE | Admit: 2013-08-03 | Discharge: 2013-08-03 | Disposition: A | Discharge status: Home / Self Care | Payer: BC Managed Care – PPO | Source: Ambulatory Visit | Attending: Surgery | Admitting: Surgery

## 2013-08-03 ENCOUNTER — Other Ambulatory Visit (INDEPENDENT_AMBULATORY_CARE_PROVIDER_SITE_OTHER): Payer: Self-pay | Admitting: *Deleted

## 2013-08-03 ENCOUNTER — Telehealth (INDEPENDENT_AMBULATORY_CARE_PROVIDER_SITE_OTHER): Payer: Self-pay

## 2013-08-03 ENCOUNTER — Telehealth (INDEPENDENT_AMBULATORY_CARE_PROVIDER_SITE_OTHER): Payer: Self-pay | Admitting: *Deleted

## 2013-08-03 ENCOUNTER — Encounter (HOSPITAL_COMMUNITY): Payer: Self-pay

## 2013-08-03 DIAGNOSIS — R1032 Left lower quadrant pain: Secondary | ICD-10-CM

## 2013-08-03 DIAGNOSIS — K5730 Diverticulosis of large intestine without perforation or abscess without bleeding: Secondary | ICD-10-CM | POA: Insufficient documentation

## 2013-08-03 DIAGNOSIS — K4090 Unilateral inguinal hernia, without obstruction or gangrene, not specified as recurrent: Secondary | ICD-10-CM | POA: Insufficient documentation

## 2013-08-03 DIAGNOSIS — R109 Unspecified abdominal pain: Secondary | ICD-10-CM

## 2013-08-03 LAB — POCT I-STAT CREATININE: CREATININE: 1 mg/dL (ref 0.50–1.35)

## 2013-08-03 MED ORDER — CIPROFLOXACIN HCL 500 MG PO TABS: 500.0000 mg | ORAL_TABLET | Freq: Two times a day (BID) | ORAL | Status: DC

## 2013-08-03 MED ORDER — METRONIDAZOLE 250 MG PO TABS: 500.0000 mg | ORAL_TABLET | Freq: Three times a day (TID) | ORAL | Status: DC

## 2013-08-03 MED ORDER — IOHEXOL 300 MG/ML  SOLN
100.0000 mL | Freq: Once | INTRAMUSCULAR | Status: AC | PRN
  Administered 2013-08-03: 100 mL via INTRAVENOUS

## 2013-08-03 NOTE — Telephone Encounter (Signed)
Patient states his divertulitis has flared up again and Dr. Marlou Starks advised him to call for a Ct to be done and to start Flagyl and Cipro after the CT . Order obtained by DR. Blackman CT Abdomen /pelvis with/ without contrast CT today@ 12noon  Labs BUN / Creat ordered to be done @ 1130 am Patient will p/u contrast at [facility] this am ABT sent to [facility] [phone]  Patient   aware

## 2013-08-03 NOTE — Telephone Encounter (Signed)
Called pt with appt info 

## 2013-08-03 NOTE — Telephone Encounter (Signed)
3.30.15 Called and got Auth # [id number] from [facility] for CT Abdomen Pelvis w Contrast per online with [email or web address]

## 2013-08-03 NOTE — Telephone Encounter (Signed)
We received a call report from GI. Pt's CT showed Diverticulitis / Diverticulosis.  I took the CT results to Dr. Ninfa Linden, he advised to have pt start the Flagyl and Cipro.  And to advise if he gets any worse to go to the ED, otherwise to follow up with Dr. Marlou Starks next week.  I couldn't find an open slot in Dr. Ethlyn Gallery schedule so I advised that I would send Dr. Ethlyn Gallery assistant a message and she will call with an appt.  Pt understands and is agreeance at this time.Anderson Malta

## 2013-08-10 ENCOUNTER — Encounter (INDEPENDENT_AMBULATORY_CARE_PROVIDER_SITE_OTHER): Payer: Self-pay | Admitting: General Surgery

## 2013-08-10 ENCOUNTER — Ambulatory Visit (INDEPENDENT_AMBULATORY_CARE_PROVIDER_SITE_OTHER): Payer: BC Managed Care – PPO | Admitting: General Surgery

## 2013-08-10 VITALS — BP 134/90 | HR 78 | Temp 98.7°F | Resp 16 | Ht 68.0 in | Wt 213.8 lb

## 2013-08-10 DIAGNOSIS — K5732 Diverticulitis of large intestine without perforation or abscess without bleeding: Secondary | ICD-10-CM

## 2013-08-10 NOTE — Patient Instructions (Signed)
Finish cipro and flagyl and call if it flares again

## 2013-08-10 NOTE — Progress Notes (Signed)
Subjective:     Patient ID: Russell Soto, male   DOB: 02/25/1951, 63 y.o.   MRN: [id number]  HPI The patient is a 63 year old white male who we have followed in the past for diverticulitis. We have not seen him since December. He was doing well until just a couple weeks ago when he started to develop some left lower quadrant pain. He is very familiar with the pain and called immediately and was started on Cipro and Flagyl. He did undergo a CT scan which showed some mild inflammatory change of his proximal sigmoid colon but no evidence of perforation or abscess. His symptoms got better very quickly on antibiotics. He feels well now.  Review of Systems  Constitutional: Negative.   HENT: Negative.   Eyes: Negative.   Respiratory: Negative.   Cardiovascular: Negative.   Gastrointestinal: Negative.   Endocrine: Negative.   Genitourinary: Negative.   Musculoskeletal: Negative.   Skin: Negative.   Allergic/Immunologic: Negative.   Neurological: Negative.   Hematological: Negative.   Psychiatric/Behavioral: Negative.        Objective:   Physical Exam  Constitutional: He is oriented to person, place, and time. He appears well-developed and well-nourished.  HENT:  Head: Normocephalic and atraumatic.  Eyes: Conjunctivae and EOM are normal. Pupils are equal, round, and reactive to light.  Neck: Normal range of motion. Neck supple.  Cardiovascular: Normal rate, regular rhythm and normal heart sounds.   Pulmonary/Chest: Effort normal and breath sounds normal.  Abdominal: Soft. Bowel sounds are normal.  Musculoskeletal: Normal range of motion.  Neurological: He is alert and oriented to person, place, and time.  Skin: Skin is warm and dry.  Psychiatric: He has a normal mood and affect. His behavior is normal.       Assessment:     The patient had a recent episode of simple diverticulitis that responded very quickly to oral antibiotics     Plan:     At this point he will continue to make  diet modifications. He will finish his course of oral antibiotics. He agrees to call us if he feels this beginning again.

## 2014-06-18 ENCOUNTER — Encounter: Payer: Self-pay | Admitting: Gastroenterology

## 2014-12-28 ENCOUNTER — Encounter: Payer: Self-pay | Admitting: Gastroenterology

## 2016-02-06 IMAGING — CT CT ABD-PELV W/ CM
2 of 5 series · 15 of 46 positions shown, 17 images · IV contrast (APPLIED)
Comparison: None.

CLINICAL DATA: Left lower quadrant pain

EXAM:
CT ABDOMEN AND PELVIS WITH CONTRAST
TECHNIQUE: Multidetector CT imaging of the abdomen and pelvis was performed
using the standard protocol following bolus administration of
intravenous contrast.
CONTRAST:  100mL OMNIPAQUE IOHEXOL 300 MG/ML  SOLN

[Series 2: abd/ pelvis 5.0 i30f 1 · axial · 0.92mm/px · z∈[-912,-442]mm · 12 of 106 slices shown, 14 images]
[im 6/106  soft-tissue]
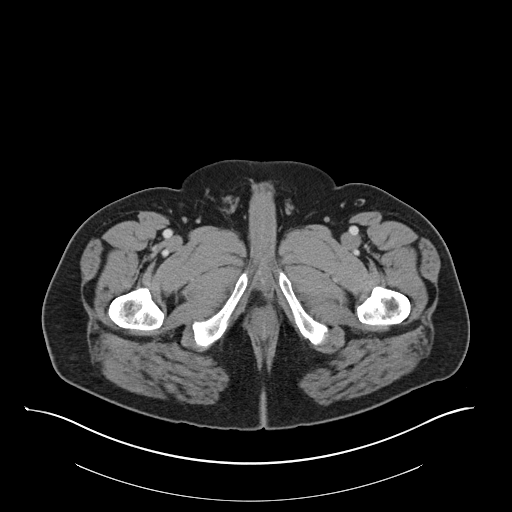
[im 6/106  bone]
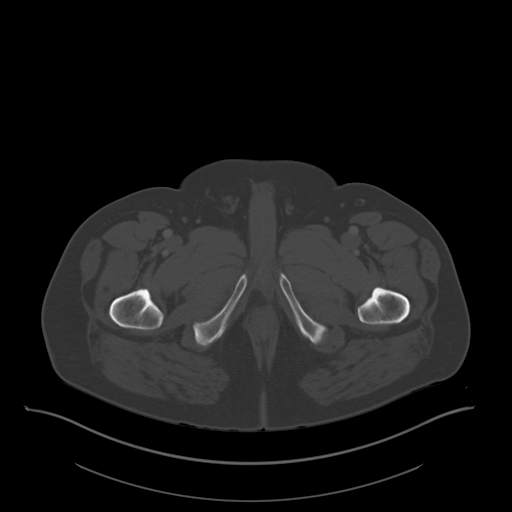
[im 17/106  soft-tissue]
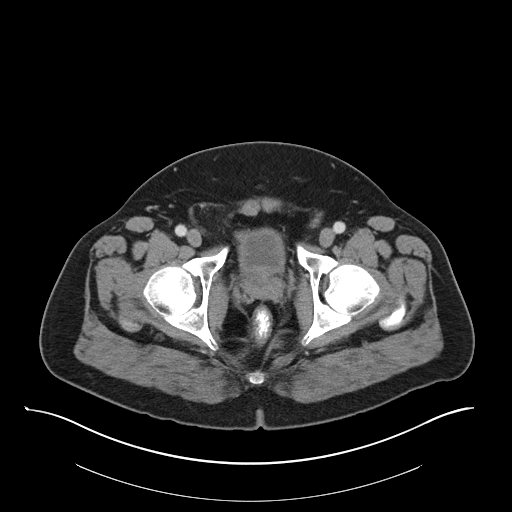
[im 23/106  soft-tissue]
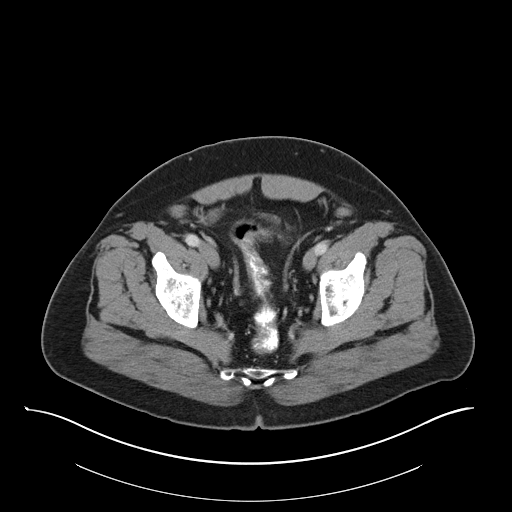
[im 34/106  soft-tissue]
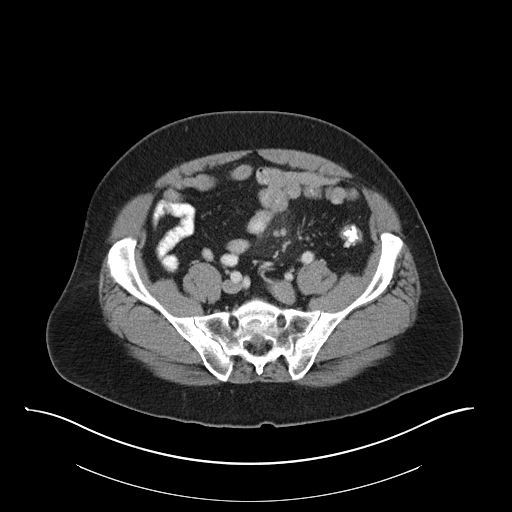
[im 39/106  soft-tissue]
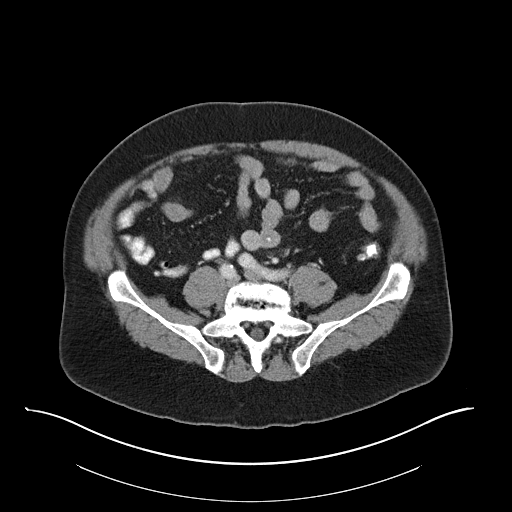
[im 50/106  soft-tissue]
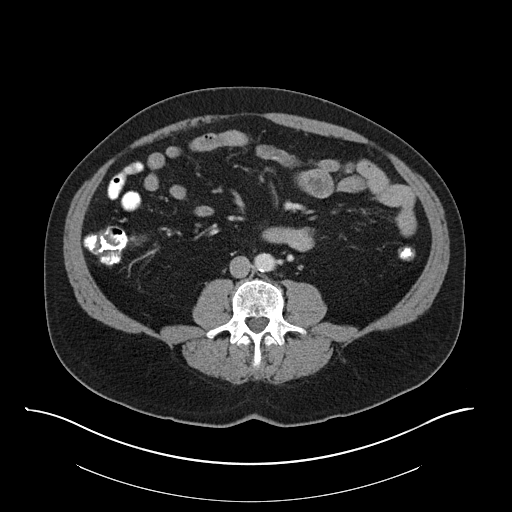
[im 56/106  soft-tissue]
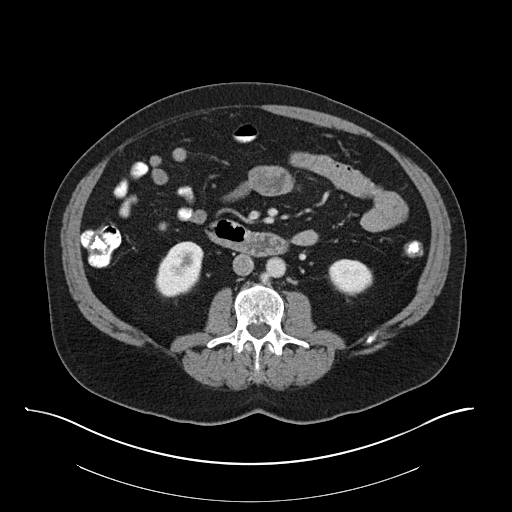
[im 67/106  soft-tissue]
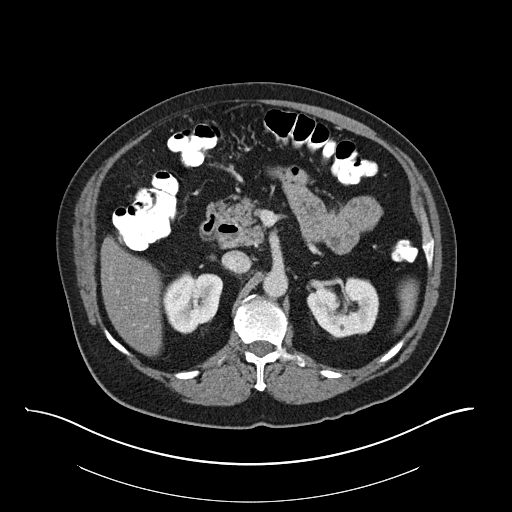
[im 72/106  soft-tissue]
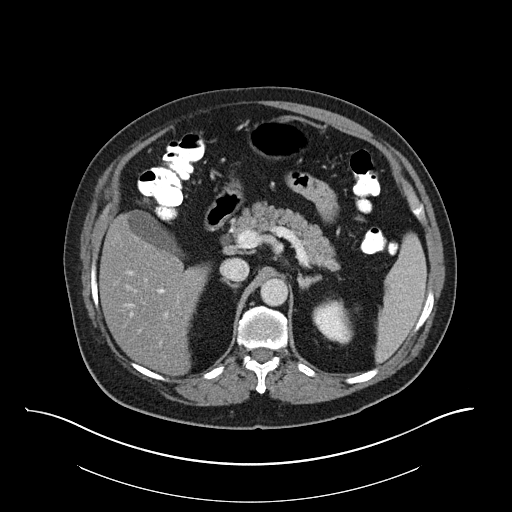
[im 72/106  bone]
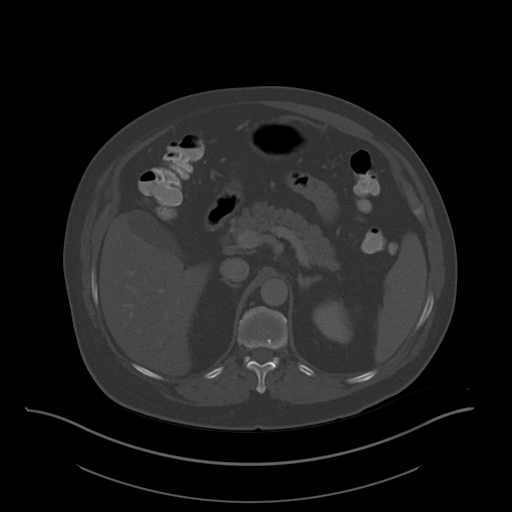
[im 83/106  soft-tissue]
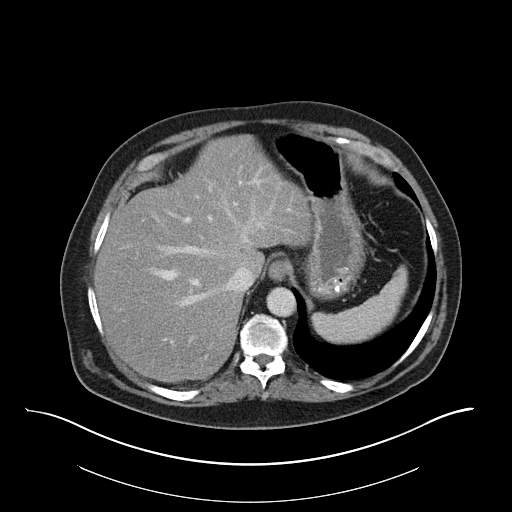
[im 89/106  soft-tissue]
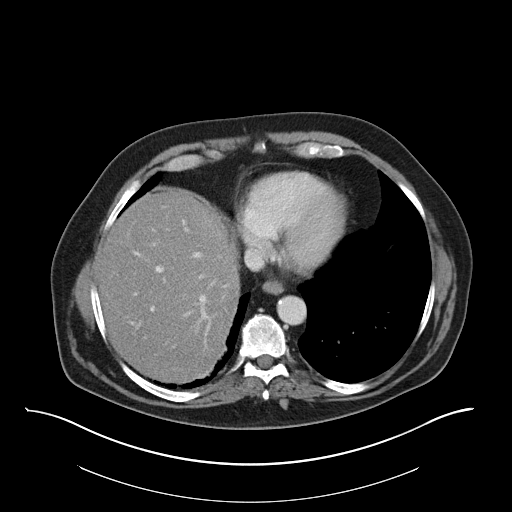
[im 100/106  soft-tissue]
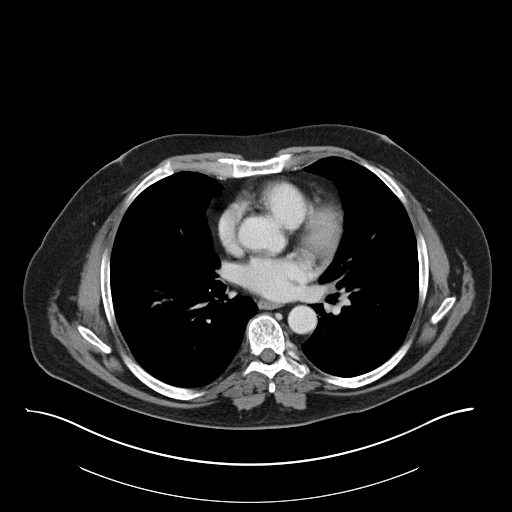

[Series 4: coronal soft tissue · coronal · 1.03mm/px · 3 of 103 slices shown]
[im 35/103  soft-tissue]
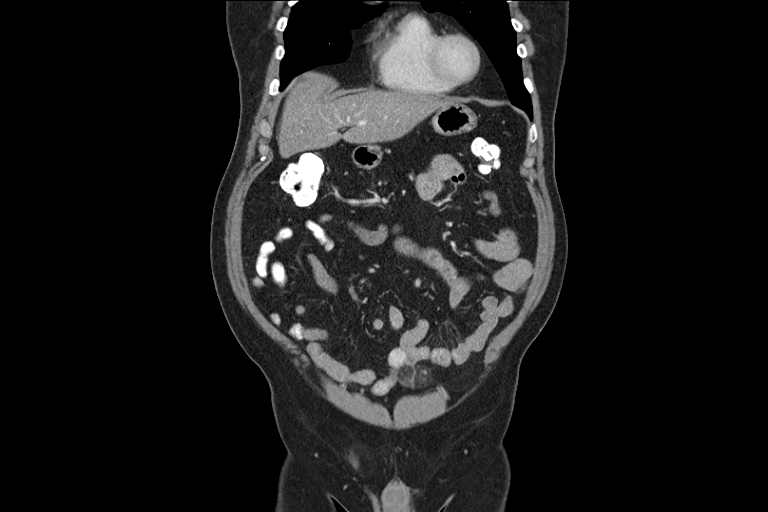
[im 46/103  soft-tissue]
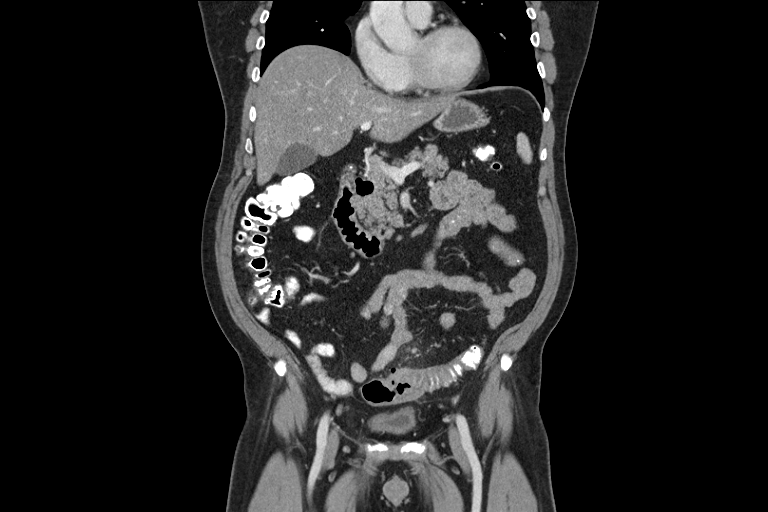
[im 57/103  soft-tissue]
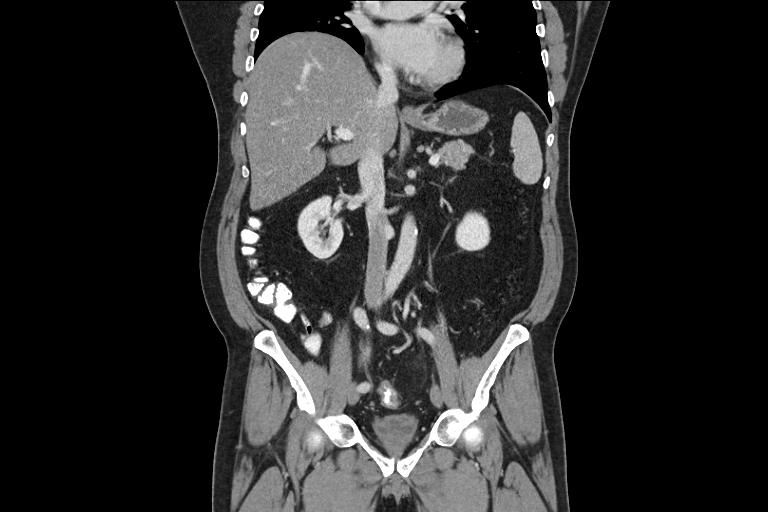

[15 of 46 positions shown; findings below may reference images not displayed]

FINDINGS: Minimal atelectasis and/or scarring within the lung bases.

The liver, spleen, adrenals, pancreas are unremarkable. Multiple
small subcentimeter low attenuating, nonenhancing foci are
appreciated within the kidneys likely representing small cysts,
otherwise unremarkable.

There is no evidence of an abdominal aortic aneurysm.

The celiac, SMA, IMA, portal vein are opacified.

A segment of bowel wall thickening with mild inflammatory change in
the surrounding fat is appreciated within the proximal sigmoid
colon. There are no associated loculated fluid collections nor
pneumoperitoneum.

Diverticulosis appreciated within the distal descending and proximal
sigmoid colon.

There is no evidence of bowel obstruction. The appendix is
identified and is unremarkable.

No further evidence of abdominal or pelvic masses, free fluid, no
adenopathy.

A fat containing right inguinal hernia is appreciated. There is no
evidence of abdominal wall hernia.

There is no evidence of aggressive appearing osseous lesions.
IMPRESSION: 1. Findings consistent with diverticulitis in the proximal sigmoid
colon.
2. Diverticulosis within the sigmoid colon
3. Likely small cysts within the kidneys.
4. These results will be called to the ordering clinician or
representative by the Radiologist Assistant, and communication
documented in the PACS Dashboard.
5. Fat containing right inguinal hernia.

## 2017-07-10 DIAGNOSIS — Z6832 Body mass index (BMI) 32.0-32.9, adult: Secondary | ICD-10-CM | POA: Diagnosis not present

## 2017-07-10 DIAGNOSIS — E119 Type 2 diabetes mellitus without complications: Secondary | ICD-10-CM | POA: Diagnosis not present

## 2017-07-10 DIAGNOSIS — E669 Obesity, unspecified: Secondary | ICD-10-CM | POA: Diagnosis not present

## 2017-07-10 DIAGNOSIS — I10 Essential (primary) hypertension: Secondary | ICD-10-CM | POA: Diagnosis not present

## 2017-07-10 DIAGNOSIS — Z1389 Encounter for screening for other disorder: Secondary | ICD-10-CM | POA: Diagnosis not present

## 2017-11-11 DIAGNOSIS — E1169 Type 2 diabetes mellitus with other specified complication: Secondary | ICD-10-CM | POA: Diagnosis not present

## 2017-11-11 DIAGNOSIS — E669 Obesity, unspecified: Secondary | ICD-10-CM | POA: Diagnosis not present

## 2017-11-11 DIAGNOSIS — Z6832 Body mass index (BMI) 32.0-32.9, adult: Secondary | ICD-10-CM | POA: Diagnosis not present

## 2017-11-11 DIAGNOSIS — I10 Essential (primary) hypertension: Secondary | ICD-10-CM | POA: Diagnosis not present

## 2017-12-02 DIAGNOSIS — R82998 Other abnormal findings in urine: Secondary | ICD-10-CM | POA: Diagnosis not present

## 2017-12-02 DIAGNOSIS — I10 Essential (primary) hypertension: Secondary | ICD-10-CM | POA: Diagnosis not present

## 2017-12-02 DIAGNOSIS — E1169 Type 2 diabetes mellitus with other specified complication: Secondary | ICD-10-CM | POA: Diagnosis not present

## 2017-12-02 DIAGNOSIS — Z125 Encounter for screening for malignant neoplasm of prostate: Secondary | ICD-10-CM | POA: Diagnosis not present

## 2017-12-09 DIAGNOSIS — E669 Obesity, unspecified: Secondary | ICD-10-CM | POA: Diagnosis not present

## 2017-12-09 DIAGNOSIS — Z23 Encounter for immunization: Secondary | ICD-10-CM | POA: Diagnosis not present

## 2017-12-09 DIAGNOSIS — Z0000 Encounter for general adult medical examination without abnormal findings: Secondary | ICD-10-CM | POA: Diagnosis not present

## 2017-12-09 DIAGNOSIS — I10 Essential (primary) hypertension: Secondary | ICD-10-CM | POA: Diagnosis not present

## 2017-12-09 DIAGNOSIS — E1169 Type 2 diabetes mellitus with other specified complication: Secondary | ICD-10-CM | POA: Diagnosis not present

## 2017-12-09 DIAGNOSIS — Z6832 Body mass index (BMI) 32.0-32.9, adult: Secondary | ICD-10-CM | POA: Diagnosis not present

## 2017-12-10 DIAGNOSIS — E1169 Type 2 diabetes mellitus with other specified complication: Secondary | ICD-10-CM | POA: Diagnosis not present

## 2017-12-11 DIAGNOSIS — Z1212 Encounter for screening for malignant neoplasm of rectum: Secondary | ICD-10-CM | POA: Diagnosis not present

## 2018-01-23 ENCOUNTER — Encounter (HOSPITAL_COMMUNITY): Payer: Self-pay | Admitting: Emergency Medicine

## 2018-01-23 ENCOUNTER — Emergency Department (HOSPITAL_COMMUNITY): Payer: Medicare Other

## 2018-01-23 ENCOUNTER — Emergency Department (HOSPITAL_COMMUNITY)
Admission: EM | Admit: 2018-01-23 | Discharge: 2018-01-24 | Disposition: A | Discharge status: Home / Self Care | Payer: Medicare Other | Attending: Emergency Medicine | Admitting: Emergency Medicine

## 2018-01-23 DIAGNOSIS — Z87891 Personal history of nicotine dependence: Secondary | ICD-10-CM | POA: Diagnosis not present

## 2018-01-23 DIAGNOSIS — S82831A Other fracture of upper and lower end of right fibula, initial encounter for closed fracture: Secondary | ICD-10-CM | POA: Diagnosis not present

## 2018-01-23 DIAGNOSIS — S99911A Unspecified injury of right ankle, initial encounter: Secondary | ICD-10-CM | POA: Diagnosis not present

## 2018-01-23 DIAGNOSIS — Y9222 Religious institution as the place of occurrence of the external cause: Secondary | ICD-10-CM | POA: Diagnosis not present

## 2018-01-23 DIAGNOSIS — M25571 Pain in right ankle and joints of right foot: Secondary | ICD-10-CM | POA: Diagnosis not present

## 2018-01-23 DIAGNOSIS — Z79899 Other long term (current) drug therapy: Secondary | ICD-10-CM | POA: Diagnosis not present

## 2018-01-23 DIAGNOSIS — W010XXA Fall on same level from slipping, tripping and stumbling without subsequent striking against object, initial encounter: Secondary | ICD-10-CM | POA: Diagnosis not present

## 2018-01-23 DIAGNOSIS — Z7982 Long term (current) use of aspirin: Secondary | ICD-10-CM | POA: Diagnosis not present

## 2018-01-23 DIAGNOSIS — S8251XA Displaced fracture of medial malleolus of right tibia, initial encounter for closed fracture: Secondary | ICD-10-CM | POA: Diagnosis not present

## 2018-01-23 DIAGNOSIS — R030 Elevated blood-pressure reading, without diagnosis of hypertension: Secondary | ICD-10-CM | POA: Diagnosis not present

## 2018-01-23 DIAGNOSIS — Y9389 Activity, other specified: Secondary | ICD-10-CM | POA: Diagnosis not present

## 2018-01-23 DIAGNOSIS — Y998 Other external cause status: Secondary | ICD-10-CM | POA: Diagnosis not present

## 2018-01-23 DIAGNOSIS — S82391A Other fracture of lower end of right tibia, initial encounter for closed fracture: Secondary | ICD-10-CM | POA: Insufficient documentation

## 2018-01-23 MED ORDER — OXYCODONE-ACETAMINOPHEN 5-325 MG PO TABS
1.0000 | ORAL_TABLET | Freq: Once | ORAL | Status: DC
  Filled 2018-01-23: qty 1

## 2018-01-23 MED ORDER — OXYCODONE-ACETAMINOPHEN 5-325 MG PO TABS
1.0000 | ORAL_TABLET | Freq: Once | ORAL | Status: AC
  Administered 2018-01-23: 1 via ORAL
  Filled 2018-01-23: qty 1

## 2018-01-23 NOTE — ED Provider Notes (Signed)
[facility] EMERGENCY DEPARTMENT Provider Note   CSN: [id number] Arrival date & time: 01/23/18  2028     History   Chief Complaint Chief Complaint  Patient presents with  . Ankle Injury    HPI Russell Soto is a 67 y.o. male presenting with pain and swelling to the right ankle.  Patient states that he was walking around [place] area at [place] this evening when he tripped over a stick, at that time patient felt his ankle rolled inward and felt the joint dislocate and fell to the ground.  Patient states that he quickly grabbed and relocated his ankle and attempted to stand followed by a sharp severe pain to the ankle.  Patient states that his pain has been constant since the incident and he has not taken anything for his pain.  Patient denies loss of consciousness, head injury or any other pain at this time states that his only pain is from his right ankle.  HPI  Past Medical History:  Diagnosis Date  . Blood transfusion 1997  . BPH (benign prostatic hyperplasia)   . Colon polyps 06/2011   Tubular adenomas polyps  . Diverticulitis   . Diverticulosis   . Elbow fracture, right   . Ulcer 1997   gastric/bleeding    Patient Active Problem List   Diagnosis Date Noted  . Diverticulitis of colon without hemorrhage 10/15/2012  . Diverticulosis 03/24/2012  . Hx of adenomatous colonic polyps 03/24/2012  . BPH (benign prostatic hyperplasia) 03/24/2012    History reviewed. No pertinent surgical history.      Home Medications    Prior to Admission medications   Medication Sig Start Date End Date Taking? Authorizing Provider  Ascorbic Acid (VITAMIN C) 500 MG CAPS Take 1 capsule by mouth daily.    [provider]  aspirin 325 MG EC tablet Take 325 mg by mouth daily.    [provider]  ciprofloxacin (CIPRO) 500 MG tablet Take 1 tablet (500 mg total) by mouth 2 (two) times daily. 08/03/13   Coralie Keens, MD  Cyanocobalamin (VITAMIN B 12  PO) Take 1 tablet by mouth daily.    [provider]  fish oil-omega-3 fatty acids 1000 MG capsule Take 1 g by mouth daily.    [provider]  Flaxseed, Linseed, (FLAX SEED OIL PO) Take 1 tablet by mouth daily.    [provider]  Ginkgo Biloba (GINKOBA PO) Take 2 tablets by mouth daily.    [provider]  metroNIDAZOLE (FLAGYL) 250 MG tablet Take 2 tablets (500 mg total) by mouth 3 (three) times daily. 08/03/13   Coralie Keens, MD  Multiple Vitamins-Minerals (MULTIVITAMIN WITH MINERALS) tablet Take 1 tablet by mouth daily.    [provider]  oxyCODONE-acetaminophen (PERCOCET/ROXICET) 5-325 MG tablet Take 1 tablet by mouth every 4 (four) hours as needed for severe pain. 01/24/18   Nuala Alpha A, PA-C  Red Yeast Rice 600 MG TABS Take 1 tablet by mouth daily.    [provider]  Tamsulosin HCl (FLOMAX) 0.4 MG CAPS Take 1 capsule by mouth daily after supper. 05/09/11   [provider]    Family History Family History  Problem Relation Age of Onset  . Diabetes Mother   . Stroke Mother     Social History Social History   Tobacco Use  . Smoking status: Former Smoker    Last attempt to quit: 12/20/1994    Years since quitting: 23.1  . Smokeless tobacco: Never  Used  Substance Use Topics  . Alcohol use: No  . Drug use: No     Allergies   Patient has no known allergies.   Review of Systems Review of Systems  Constitutional: Negative.  Negative for chills, fatigue and fever.  Musculoskeletal: Positive for arthralgias and joint swelling. Negative for neck pain.  Skin: Negative for color change and wound.  Neurological: Negative.  Negative for syncope, weakness and numbness.     Physical Exam Updated Vital Signs BP (!) 164/94 (BP Location: Right Arm)   Pulse 79   Temp 98.6 F (37 C) (Oral)   Resp 18   SpO2 96%   Physical Exam  Constitutional: He appears well-developed and well-nourished. No distress.    HENT:  Head: Normocephalic and atraumatic.  Right Ear: External ear normal.  Left Ear: External ear normal.  Nose: Nose normal.  Eyes: Pupils are equal, round, and reactive to light. EOM are normal.  Neck: Trachea normal and normal range of motion. No tracheal deviation present.  Cardiovascular:  Pulses:      Dorsalis pedis pulses are 2+ on the right side, and 2+ on the left side.       Posterior tibial pulses are 2+ on the right side, and 2+ on the left side.  Pulmonary/Chest: Effort normal. No respiratory distress.  Abdominal: Soft. There is no tenderness. There is no rebound and no guarding.  Musculoskeletal: He exhibits tenderness.       Right knee: Normal.       Left knee: Normal.       Right ankle: He exhibits decreased range of motion, swelling and ecchymosis. Tenderness. Lateral malleolus and medial malleolus tenderness found. Achilles tendon normal.       Left ankle: Normal.       Right lower leg: Normal.       Left lower leg: Normal.       Right foot: Normal. There is normal capillary refill.       Left foot: Normal.  Patient with severe swelling to right ankle, diffuse ecchymosis and decreased range of motion due to pain. Capillary refill intact to all toes.  Sensation intact to light touch.  Patient is able to dorsi and plantar flex with minimal range of motion and great increase in pain.  Pulses intact and equal bilaterally.  Feet:  Right Foot:  Protective Sensation: 3 sites tested. 3 sites sensed.  Left Foot:  Protective Sensation: 3 sites tested. 3 sites sensed.  Neurological: He is alert. No sensory deficit. GCS eye subscore is 4. GCS verbal subscore is 5. GCS motor subscore is 6.  Skin: Skin is warm and dry. Capillary refill takes less than 2 seconds.  Psychiatric: He has a normal mood and affect. His behavior is normal.    ED Treatments / Results  Labs (all labs ordered are listed, but only abnormal results are displayed) Labs Reviewed - No data to  display  EKG None  Radiology Dg Ankle Complete Right  Result Date: 01/23/2018 CLINICAL DATA:  Lateral right ankle pain after a fall. EXAM: RIGHT ANKLE - COMPLETE 3+ VIEW COMPARISON:  None. FINDINGS: There is an oblique mildly comminuted fracture of the distal right fibular shaft with about 4 mm posterior displacement and mild medial angulation of the distal fracture fragments. Tiny chip fracture off of the posterior malleolus without significant displacement. No evidence of dislocation at the ankle joint. Diffuse soft tissue swelling about the right ankle. No destructive or expansile bone lesions. Degenerative changes  in the intertarsal joints. Vascular calcifications. IMPRESSION: Acute posttraumatic fractures of the distal right fibula and posterior malleolus. Electronically Signed   By: Lucienne Capers M.D.   On: 01/23/2018 21:38    Procedures Procedures (including critical care time)  Medications Ordered in ED Medications  oxyCODONE-acetaminophen (PERCOCET/ROXICET) 5-325 MG per tablet 1 tablet (1 tablet Oral Given 01/23/18 2157)  acetaminophen (TYLENOL) tablet 500 mg (500 mg Oral Given 01/24/18 0005)     Initial Impression / Assessment and Plan / ED Course  I have reviewed the triage vital signs and the nursing notes.  Pertinent labs & imaging results that were available during my care of the patient were reviewed by me and considered in my medical decision making (see chart for details).  Clinical Course as of Jan 24 130  Thu Jan 23, 2018  2244 Patient's case discussed with Dr. Darl Householder who recommends Ortho consult.   [BM]  Fri Jan 24, 2018  0130 Ortho consult called to Dr. Griffin Basil.  Dr. Griffin Basil has reviewed the patient's imaging, case discussed including possibility of reduced dislocation prior to arrival.  Dr. Griffin Basil advises splinting patient in the emergency department with posterior slab and use stirrup, crutches/nonweightbearing and follow-up with his office tomorrow morning at 8 AM.    [BM]    Clinical Course User Index [BM] Deliah Boston, PA-C   Patient with acute onset right ankle pain.  Imaging today shows MPRESSION: Acute posttraumatic fractures of the distal right fibula and posterior malleolus. Pain controlled in emergency department, consult called to Ortho Dr. Griffin Basil as above and ED course.  Patient with pedal pulses intact, sensation intact, capillary refill intact, all compartments soft.  Patient placed in splint and given crutches.  Short course of pain medicine provided, patient informed not to drive or operate heavy machinery while using pain medication.  Patient informed to follow-up with Dr. Rich Fuchs office tomorrow morning at 8 AM. I have reevaluated the patient after splint has been placed, capillary refill and sensation intact to all toes's.  Patient informed of signs and symptoms suggestive of compartment syndrome and that if he experiences the symptoms that he must return to the emergency department as soon as possible and that he is also able to loosen his splint if necessary.  Patient and family member state understanding.  The patient was noted to have elevated BP in ED today. Likely increased due to acute pain. Hx of HTN. I have spoken with the patient regarding elevated blood pressure readings and the need for improved management. I instructed the patient to followup with their PCP within 1 week for BP check. I also counseled the patient regarding the signs and symptoms which would require an emergent visit to an emergency department for hypertensive urgency and/or hypertensive emergency.  At this time there does not appear to be any evidence of an acute emergency medical condition and the patient appears stable for discharge with appropriate outpatient follow up. Diagnosis was discussed with patient who verbalizes understanding of care plan and is agreeable to discharge. I have discussed return precautions with patient and wife at bedside who verbalize  understanding of return precautions. Patient strongly encouraged to follow-up with their PCP and Dr. Griffin Basil tomorrow morning. All questions answered.  Patient's case discussed with Dr. Darl Householder  who agrees with plan to discharge with follow-up.     Note: Portions of this report may have been transcribed using voice recognition software. Every effort was made to ensure accuracy; however, inadvertent computerized transcription errors may  still be present.     Final Clinical Impressions(s) / ED Diagnoses   Final diagnoses:  Closed fracture of distal end of right fibula, unspecified fracture morphology, initial encounter  Closed fracture of posterior malleolus of right tibia, initial encounter  Elevated blood pressure reading    ED Discharge Orders         Ordered    oxyCODONE-acetaminophen (PERCOCET/ROXICET) 5-325 MG tablet  Every 4 hours PRN     01/24/18 0054           Deliah Boston, PA-C 01/24/18 0137    Drenda Freeze, MD 01/24/18 [phone]

## 2018-01-23 NOTE — ED Triage Notes (Signed)
Patient lost his balance and fell this evening , presents with right ankle pain with swelling , denies LOC /alert and oriented .

## 2018-01-23 NOTE — ED Notes (Signed)
Paged Ortho  

## 2018-01-24 DIAGNOSIS — M25571 Pain in right ankle and joints of right foot: Secondary | ICD-10-CM | POA: Diagnosis not present

## 2018-01-24 DIAGNOSIS — S99911A Unspecified injury of right ankle, initial encounter: Secondary | ICD-10-CM | POA: Diagnosis not present

## 2018-01-24 MED ORDER — OXYCODONE-ACETAMINOPHEN 5-325 MG PO TABS: 1.0000 | ORAL_TABLET | ORAL | 0 refills | Status: DC | PRN

## 2018-01-24 MED ORDER — ACETAMINOPHEN 500 MG PO TABS
500.0000 mg | ORAL_TABLET | Freq: Once | ORAL | Status: AC
  Administered 2018-01-24: 500 mg via ORAL
  Filled 2018-01-24: qty 1

## 2018-01-24 NOTE — Discharge Instructions (Addendum)
Please return to the Emergency Department for any new or worsening symptoms or if your symptoms do not improve. Please be sure to follow up with your Primary Care Physician as soon as possible regarding your visit today. If you do not have a Primary Doctor please use the resources below to establish one. Your case has been discussed with the orthopedic surgeon, Dr. Griffin Basil.  Please report to his office tomorrow morning for further evaluation of your ankle fracture. You may use the pain medication Percocet as prescribed for severe pain, please do not drive or operate heavy machinery while taking this medication because it will make you drowsy. Your blood pressure was elevated today, please follow-up with your primary care provider for blood pressure recheck and medication management.  Contact a health care provider if: Your pain is becoming worse rather than better or is not controlled with medicines. You have increased swelling or redness in the foot. You begin to lose feeling in your foot or toes. Get help right away if: You develop a cold or blue foot or toes on the injured side. You develop severe pain in your injured leg, especially if the pain is increased with movement of your toes. Contact a doctor if: You think you are having a reaction to the medicine you are taking. You have headaches that keep coming back (recurring). You feel dizzy. You have swelling in your ankles. You have trouble with your vision. Get help right away if: You get a very bad headache. You start to feel confused. You feel weak or numb. You feel faint. You get very bad pain in your: Chest. Belly (abdomen). You throw up (vomit) more than once. You have trouble breathing.  Do not take your medicine if  develop an itchy rash, swelling in your mouth or lips, or difficulty breathing.   RESOURCE GUIDE  Chronic Pain Problems: Contact [facility]  [phone] Patients need to be referred by  their primary care doctor.  Insufficient Money for Medicine: Contact [employer]:  call "211" or [place] [phone].  No Primary Care Doctor: Call [facility]  [phone] - can help you locate a primary care doctor that  accepts your insurance, provides certain services, etc. Physician Referral Service- [phone]  Agencies that provide inexpensive medical care: [facility]  [facility]  [phone] [facility]  [phone] [facility]  [phone] [facility]  [phone] [facility]  [phone]  [place] Providers: [facility]- [address], [address]  [phone], Mon-Fri 9am-7pm, Sat 9am-1pm [place], [address] [place]  [place], [address] [place]  [place]- [address]  [place], [address], [phone]  Only accepts [place] patients after they have their name  applied to their card  Self Pay (no insurance) in [facility]: Sickle Cell Patients: Dr Kevan Ny, [facility]  [place], [facility]- [facility] [place]- [address], [facility]- see information above (Speak to [employer] if you do not have insurance)       -  [facility]- [place], [place],  [place], [place]  Dr Vista Lawman-  [address], [place], [place] [facility]- [address], [phone]       -  [facility] [place]- [address], [place], also [address], [phone]       -    [facility]- [address], [place], 1st & 3rd Saturday   every month, 10am-1pm  1) Find a Doctor and Pay Out of Pocket Although you won't have to find out who is covered by your insurance plan, it is a good idea to ask around and get recommendations. You will then need to call the office and see if the doctor you have chosen will accept you as a new patient and what types of options they offer for patients who are self-pay. Some doctors offer discounts or will set up payment plans for their patients who do not have insurance, but you will need to ask so you aren't surprised when you get to your appointment.  2) Contact Your Local Health Department Not all health departments have doctors that can see patients for sick visits, but many do, so it is worth a call to see if yours does. If you don't know where your local health department is, you can check in your phone book. The CDC also has a tool to help you locate your state's health department, and many state websites also have listings of all of their local health departments.  3) Find a [facility] If your illness is not likely to be very severe or complicated, you may want to try a walk in clinic. These are popping up all over the country in pharmacies, drugstores, and shopping centers. They're usually staffed by nurse practitioners or physician assistants that have been trained to treat common illnesses and complaints. They're usually fairly quick and inexpensive. However, if you have serious medical issues or chronic medical problems, these are probably not your best option  [facility] [place], [facility], [address], [place], phone [phone] or [phone].  Monday - Friday, call for an appointment. [place], [facility], [address], [place], phone [phone] or [phone].  Monday - Friday, call for an  appointment.  Abuse/Neglect: [place] [phone] [place] [phone] (After Hours)  Emergency Shelter:  [facility] [phone]  Maternity Homes: Room at the [place] [phone] [place] [phone]  MRSA Hotline #:   407-455-9956  [facility] [place] Dept. [address]         [place]  Sela Hua Phone:  [phone]                                  Phone:  [phone]                   Phone:  [place], [place]- [phone]       -     [facility] in [place], [address],                                  [phone], [place] [phone] or [phone] (After Hours)   [place]  Substance Abuse Resources: Alcohol and Drug Services  [phone] [place] [phone] [facility] [place] Chinita Pester 2063078610 Residential & Outpatient Substance Abuse Program  [phone]  Psychological Services: [place]  [phone] [place]  [place], [place]. [address] [place], [place]: [phone] or [phone], [employer]  Dental Assistance  If unable to pay or uninsured, contact:  [facility] or [facility]. to become qualified for the adult dental clinic.  Patients with Medicaid: [facility] [phone] [address], [place] [address], [phone]  If unable to pay, or uninsured, contact  [facility] [phone]) or [place] [phone] in [place], [place] in [facility]) to become qualified for the adult dental clinic   Other [place]- [place], [place], [place], [address], [place], [place], 2nd and 4th Thursday of the month at 6:30am.  10 clients each day by appointment, can sometimes see walk-in patients if someone does not show for an appointment. [facility]- [address] [place]ard Danker [place], [place], [address], [place], [place], [place], [address], [place] Department- [phone] [place] [facility] [phone]

## 2018-01-27 DIAGNOSIS — Y999 Unspecified external cause status: Secondary | ICD-10-CM | POA: Diagnosis not present

## 2018-01-27 DIAGNOSIS — S8261XA Displaced fracture of lateral malleolus of right fibula, initial encounter for closed fracture: Secondary | ICD-10-CM | POA: Diagnosis not present

## 2018-01-27 DIAGNOSIS — S93431A Sprain of tibiofibular ligament of right ankle, initial encounter: Secondary | ICD-10-CM | POA: Diagnosis not present

## 2018-01-27 DIAGNOSIS — G8918 Other acute postprocedural pain: Secondary | ICD-10-CM | POA: Diagnosis not present

## 2018-01-27 DIAGNOSIS — X58XXXA Exposure to other specified factors, initial encounter: Secondary | ICD-10-CM | POA: Diagnosis not present

## 2018-02-04 DIAGNOSIS — S8261XD Displaced fracture of lateral malleolus of right fibula, subsequent encounter for closed fracture with routine healing: Secondary | ICD-10-CM | POA: Diagnosis not present

## 2018-02-18 DIAGNOSIS — S8261XD Displaced fracture of lateral malleolus of right fibula, subsequent encounter for closed fracture with routine healing: Secondary | ICD-10-CM | POA: Diagnosis not present

## 2018-02-27 DIAGNOSIS — Z23 Encounter for immunization: Secondary | ICD-10-CM | POA: Diagnosis not present

## 2018-03-11 DIAGNOSIS — S8261XD Displaced fracture of lateral malleolus of right fibula, subsequent encounter for closed fracture with routine healing: Secondary | ICD-10-CM | POA: Diagnosis not present

## 2018-04-18 DIAGNOSIS — S8261XD Displaced fracture of lateral malleolus of right fibula, subsequent encounter for closed fracture with routine healing: Secondary | ICD-10-CM | POA: Diagnosis not present

## 2018-06-11 DIAGNOSIS — Z6832 Body mass index (BMI) 32.0-32.9, adult: Secondary | ICD-10-CM | POA: Diagnosis not present

## 2018-06-11 DIAGNOSIS — I10 Essential (primary) hypertension: Secondary | ICD-10-CM | POA: Diagnosis not present

## 2018-06-11 DIAGNOSIS — E1169 Type 2 diabetes mellitus with other specified complication: Secondary | ICD-10-CM | POA: Diagnosis not present

## 2018-06-11 DIAGNOSIS — E669 Obesity, unspecified: Secondary | ICD-10-CM | POA: Diagnosis not present

## 2018-12-10 DIAGNOSIS — Z125 Encounter for screening for malignant neoplasm of prostate: Secondary | ICD-10-CM | POA: Diagnosis not present

## 2018-12-10 DIAGNOSIS — E1169 Type 2 diabetes mellitus with other specified complication: Secondary | ICD-10-CM | POA: Diagnosis not present

## 2018-12-10 DIAGNOSIS — E781 Pure hyperglyceridemia: Secondary | ICD-10-CM | POA: Diagnosis not present

## 2018-12-11 DIAGNOSIS — I10 Essential (primary) hypertension: Secondary | ICD-10-CM | POA: Diagnosis not present

## 2018-12-11 DIAGNOSIS — R82998 Other abnormal findings in urine: Secondary | ICD-10-CM | POA: Diagnosis not present

## 2018-12-17 DIAGNOSIS — I10 Essential (primary) hypertension: Secondary | ICD-10-CM | POA: Diagnosis not present

## 2018-12-17 DIAGNOSIS — E669 Obesity, unspecified: Secondary | ICD-10-CM | POA: Diagnosis not present

## 2018-12-17 DIAGNOSIS — E1169 Type 2 diabetes mellitus with other specified complication: Secondary | ICD-10-CM | POA: Diagnosis not present

## 2018-12-17 DIAGNOSIS — E781 Pure hyperglyceridemia: Secondary | ICD-10-CM | POA: Diagnosis not present

## 2018-12-17 DIAGNOSIS — Z0000 Encounter for general adult medical examination without abnormal findings: Secondary | ICD-10-CM | POA: Diagnosis not present

## 2019-02-14 DIAGNOSIS — Z23 Encounter for immunization: Secondary | ICD-10-CM | POA: Diagnosis not present

## 2019-06-02 ENCOUNTER — Ambulatory Visit: Payer: Medicare Other

## 2019-06-11 ENCOUNTER — Ambulatory Visit: Payer: Medicare Other

## 2019-06-19 ENCOUNTER — Ambulatory Visit: Payer: Medicare Other

## 2019-07-24 DIAGNOSIS — E1169 Type 2 diabetes mellitus with other specified complication: Secondary | ICD-10-CM | POA: Diagnosis not present

## 2019-07-27 DIAGNOSIS — E1169 Type 2 diabetes mellitus with other specified complication: Secondary | ICD-10-CM | POA: Diagnosis not present

## 2019-07-27 DIAGNOSIS — I10 Essential (primary) hypertension: Secondary | ICD-10-CM | POA: Diagnosis not present

## 2019-07-27 DIAGNOSIS — E669 Obesity, unspecified: Secondary | ICD-10-CM | POA: Diagnosis not present

## 2019-12-16 DIAGNOSIS — E781 Pure hyperglyceridemia: Secondary | ICD-10-CM | POA: Diagnosis not present

## 2019-12-16 DIAGNOSIS — E1169 Type 2 diabetes mellitus with other specified complication: Secondary | ICD-10-CM | POA: Diagnosis not present

## 2019-12-16 DIAGNOSIS — Z125 Encounter for screening for malignant neoplasm of prostate: Secondary | ICD-10-CM | POA: Diagnosis not present

## 2019-12-23 DIAGNOSIS — Z0000 Encounter for general adult medical examination without abnormal findings: Secondary | ICD-10-CM | POA: Diagnosis not present

## 2019-12-23 DIAGNOSIS — Z1212 Encounter for screening for malignant neoplasm of rectum: Secondary | ICD-10-CM | POA: Diagnosis not present

## 2019-12-23 DIAGNOSIS — E781 Pure hyperglyceridemia: Secondary | ICD-10-CM | POA: Diagnosis not present

## 2019-12-23 DIAGNOSIS — R82998 Other abnormal findings in urine: Secondary | ICD-10-CM | POA: Diagnosis not present

## 2019-12-23 DIAGNOSIS — E1169 Type 2 diabetes mellitus with other specified complication: Secondary | ICD-10-CM | POA: Diagnosis not present

## 2019-12-23 DIAGNOSIS — I10 Essential (primary) hypertension: Secondary | ICD-10-CM | POA: Diagnosis not present

## 2019-12-23 DIAGNOSIS — E669 Obesity, unspecified: Secondary | ICD-10-CM | POA: Diagnosis not present

## 2020-02-06 DIAGNOSIS — Z23 Encounter for immunization: Secondary | ICD-10-CM | POA: Diagnosis not present

## 2020-03-12 DIAGNOSIS — Z23 Encounter for immunization: Secondary | ICD-10-CM | POA: Diagnosis not present

## 2020-07-28 IMAGING — DX DG ANKLE COMPLETE 3+V*R*
3 series · 3 of 3 positions shown · non-contrast
Comparison: None.

CLINICAL DATA: Lateral right ankle pain after a fall.

EXAM:
RIGHT ANKLE - COMPLETE 3+ VIEW

[ankle ap]
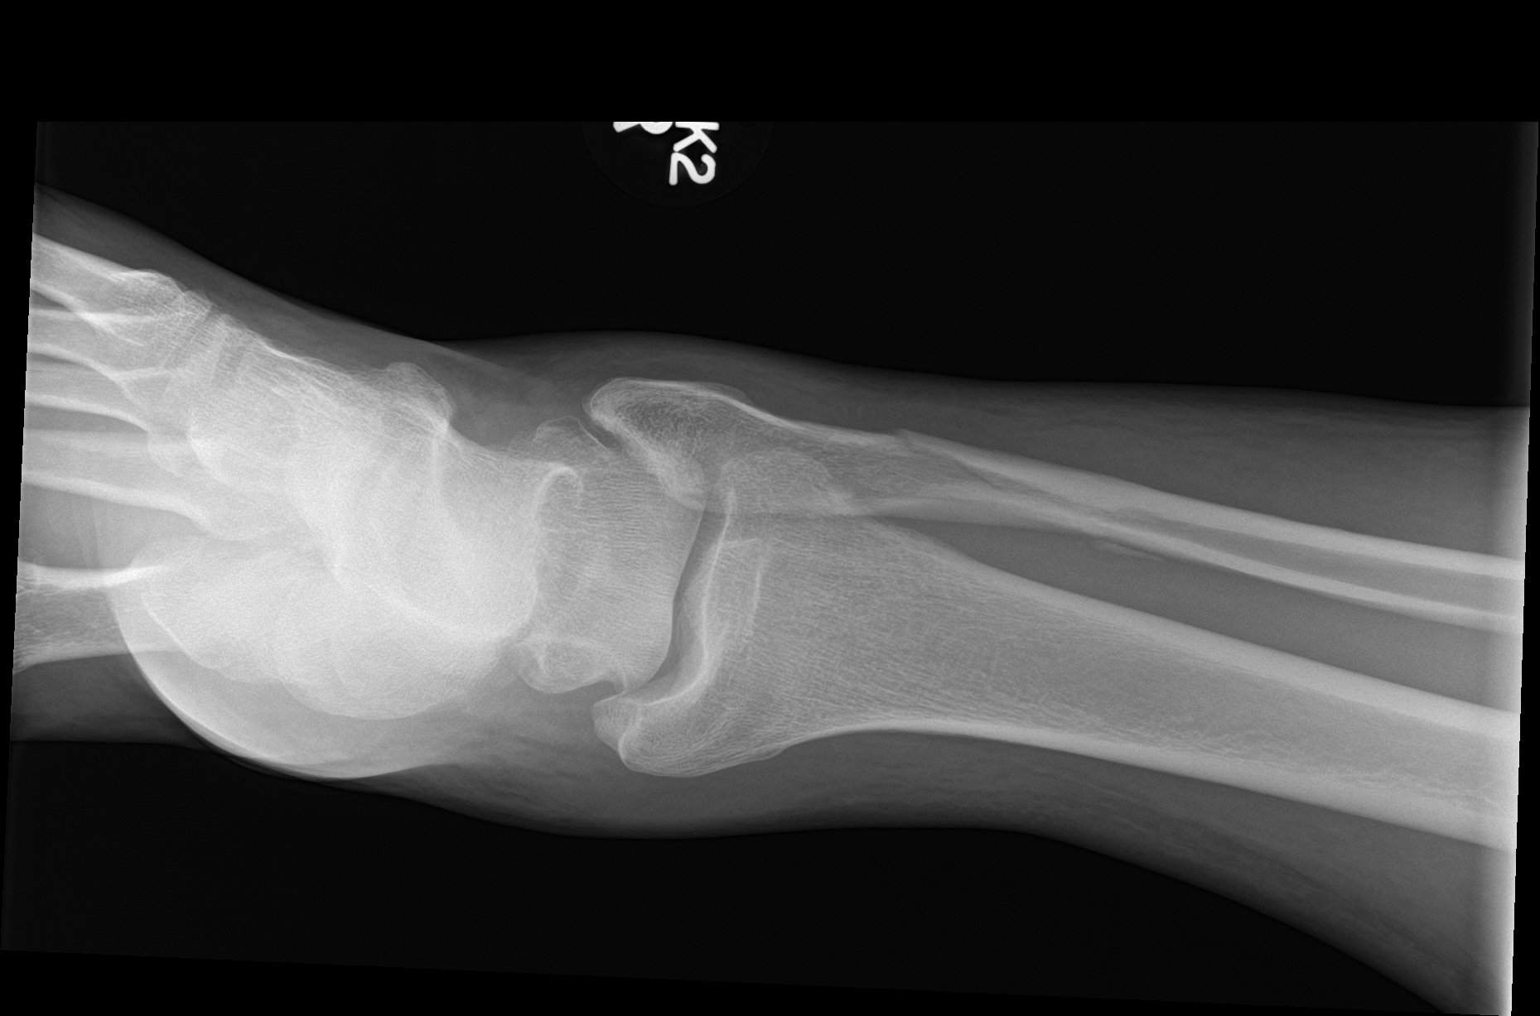

[ankle obl]
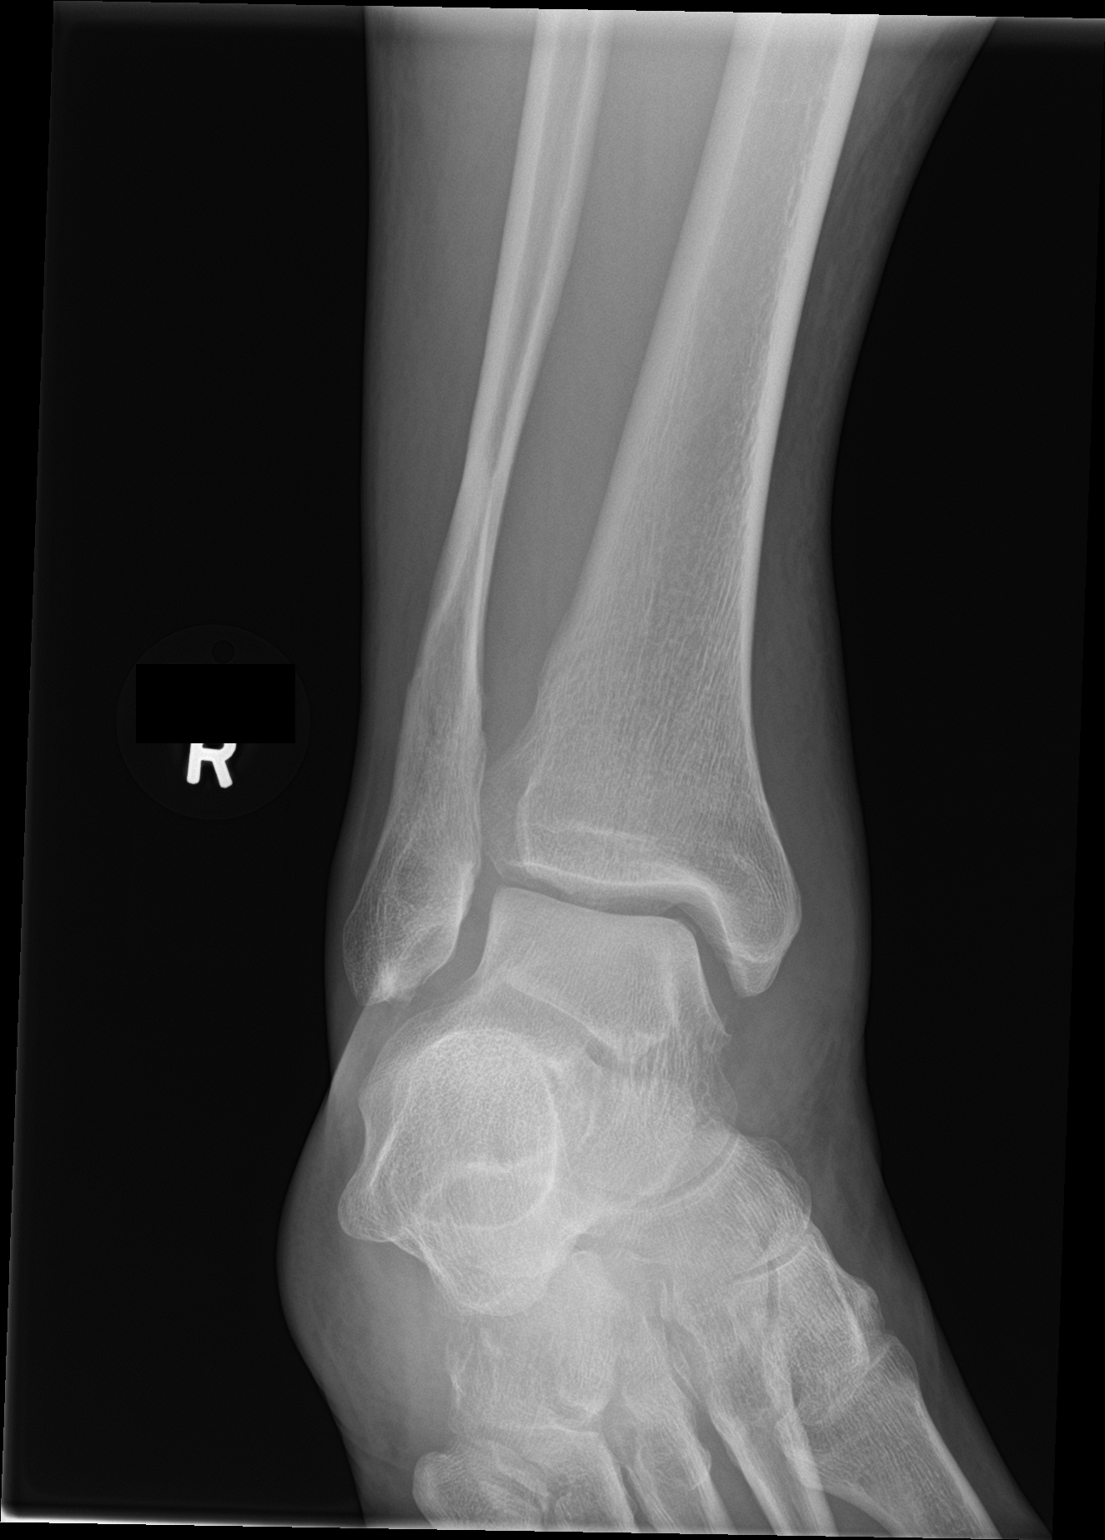

[ankle lat]
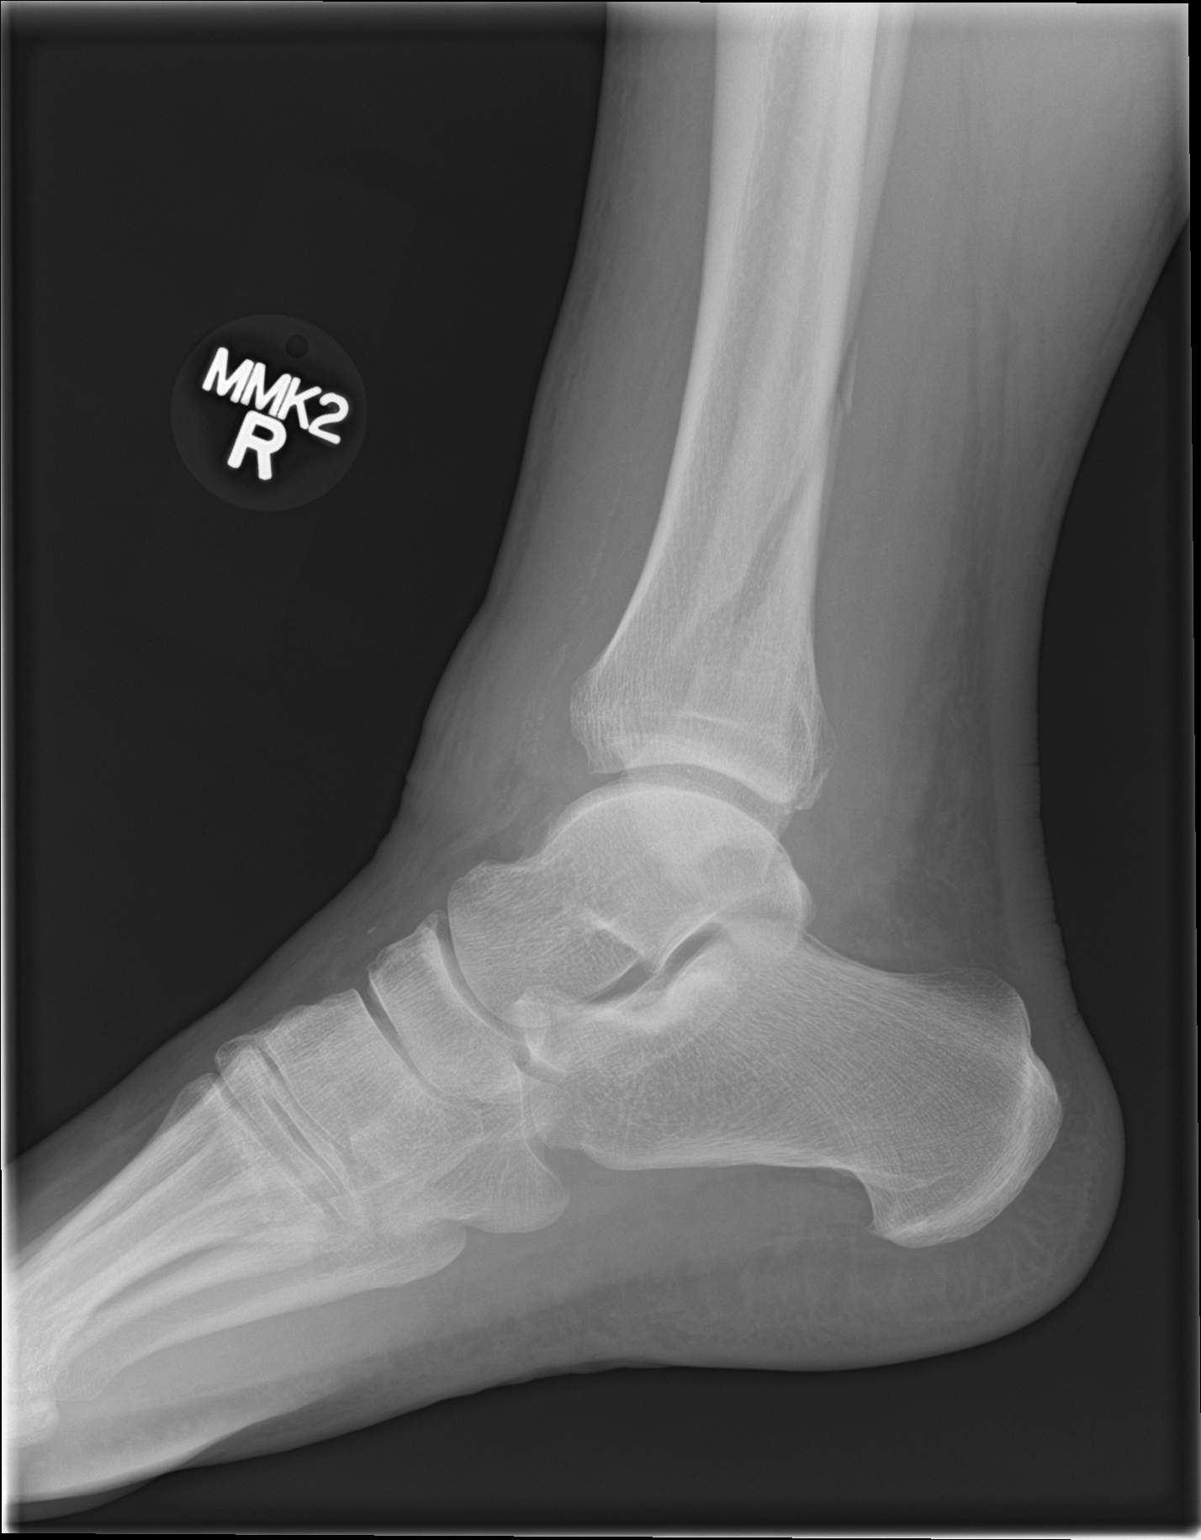

[3 of 3 positions shown; findings below may reference images not displayed]

FINDINGS: There is an oblique mildly comminuted fracture of the distal right
fibular shaft with about 4 mm posterior displacement and mild medial
angulation of the distal fracture fragments. Tiny chip fracture off
of the posterior malleolus without significant displacement. No
evidence of dislocation at the ankle joint. Diffuse soft tissue
swelling about the right ankle. No destructive or expansile bone
lesions. Degenerative changes in the intertarsal joints. Vascular
calcifications.
IMPRESSION: Acute posttraumatic fractures of the distal right fibula and
posterior malleolus.

## 2020-08-14 DIAGNOSIS — Z23 Encounter for immunization: Secondary | ICD-10-CM | POA: Diagnosis not present

## 2020-12-30 DIAGNOSIS — E1169 Type 2 diabetes mellitus with other specified complication: Secondary | ICD-10-CM | POA: Diagnosis not present

## 2020-12-30 DIAGNOSIS — I10 Essential (primary) hypertension: Secondary | ICD-10-CM | POA: Diagnosis not present

## 2020-12-30 DIAGNOSIS — Z125 Encounter for screening for malignant neoplasm of prostate: Secondary | ICD-10-CM | POA: Diagnosis not present

## 2021-01-02 DIAGNOSIS — E1169 Type 2 diabetes mellitus with other specified complication: Secondary | ICD-10-CM | POA: Diagnosis not present

## 2021-01-02 DIAGNOSIS — E669 Obesity, unspecified: Secondary | ICD-10-CM | POA: Diagnosis not present

## 2021-01-02 DIAGNOSIS — Z0000 Encounter for general adult medical examination without abnormal findings: Secondary | ICD-10-CM | POA: Diagnosis not present

## 2021-01-02 DIAGNOSIS — Z1339 Encounter for screening examination for other mental health and behavioral disorders: Secondary | ICD-10-CM | POA: Diagnosis not present

## 2021-01-02 DIAGNOSIS — R82998 Other abnormal findings in urine: Secondary | ICD-10-CM | POA: Diagnosis not present

## 2021-01-02 DIAGNOSIS — I10 Essential (primary) hypertension: Secondary | ICD-10-CM | POA: Diagnosis not present

## 2021-01-02 DIAGNOSIS — E781 Pure hyperglyceridemia: Secondary | ICD-10-CM | POA: Diagnosis not present

## 2021-01-02 DIAGNOSIS — Z1212 Encounter for screening for malignant neoplasm of rectum: Secondary | ICD-10-CM | POA: Diagnosis not present

## 2021-01-02 DIAGNOSIS — Z1331 Encounter for screening for depression: Secondary | ICD-10-CM | POA: Diagnosis not present

## 2021-02-15 DIAGNOSIS — Z23 Encounter for immunization: Secondary | ICD-10-CM | POA: Diagnosis not present

## 2021-03-04 DIAGNOSIS — Z23 Encounter for immunization: Secondary | ICD-10-CM | POA: Diagnosis not present

## 2021-07-07 DIAGNOSIS — Z20822 Contact with and (suspected) exposure to covid-19: Secondary | ICD-10-CM | POA: Diagnosis not present

## 2021-07-24 DIAGNOSIS — E1169 Type 2 diabetes mellitus with other specified complication: Secondary | ICD-10-CM | POA: Diagnosis not present

## 2021-07-24 DIAGNOSIS — E669 Obesity, unspecified: Secondary | ICD-10-CM | POA: Diagnosis not present

## 2021-07-24 DIAGNOSIS — E781 Pure hyperglyceridemia: Secondary | ICD-10-CM | POA: Diagnosis not present

## 2021-07-24 DIAGNOSIS — I10 Essential (primary) hypertension: Secondary | ICD-10-CM | POA: Diagnosis not present

## 2021-08-09 DIAGNOSIS — E1169 Type 2 diabetes mellitus with other specified complication: Secondary | ICD-10-CM | POA: Diagnosis not present

## 2021-08-11 DIAGNOSIS — Z20822 Contact with and (suspected) exposure to covid-19: Secondary | ICD-10-CM | POA: Diagnosis not present

## 2021-08-25 DIAGNOSIS — N401 Enlarged prostate with lower urinary tract symptoms: Secondary | ICD-10-CM | POA: Diagnosis not present

## 2021-08-25 DIAGNOSIS — R3912 Poor urinary stream: Secondary | ICD-10-CM | POA: Diagnosis not present

## 2021-08-25 DIAGNOSIS — R3915 Urgency of urination: Secondary | ICD-10-CM | POA: Diagnosis not present

## 2021-08-25 DIAGNOSIS — R351 Nocturia: Secondary | ICD-10-CM | POA: Diagnosis not present

## 2021-08-28 DIAGNOSIS — E1169 Type 2 diabetes mellitus with other specified complication: Secondary | ICD-10-CM | POA: Diagnosis not present

## 2021-08-28 DIAGNOSIS — E669 Obesity, unspecified: Secondary | ICD-10-CM | POA: Diagnosis not present

## 2021-08-28 DIAGNOSIS — E781 Pure hyperglyceridemia: Secondary | ICD-10-CM | POA: Diagnosis not present

## 2021-08-28 DIAGNOSIS — I10 Essential (primary) hypertension: Secondary | ICD-10-CM | POA: Diagnosis not present

## 2021-11-23 DIAGNOSIS — H2513 Age-related nuclear cataract, bilateral: Secondary | ICD-10-CM | POA: Diagnosis not present

## 2021-11-23 DIAGNOSIS — H5213 Myopia, bilateral: Secondary | ICD-10-CM | POA: Diagnosis not present

## 2021-11-23 DIAGNOSIS — H52203 Unspecified astigmatism, bilateral: Secondary | ICD-10-CM | POA: Diagnosis not present

## 2021-11-23 DIAGNOSIS — Z7984 Long term (current) use of oral hypoglycemic drugs: Secondary | ICD-10-CM | POA: Diagnosis not present

## 2021-11-23 DIAGNOSIS — E119 Type 2 diabetes mellitus without complications: Secondary | ICD-10-CM | POA: Diagnosis not present

## 2021-11-23 DIAGNOSIS — H524 Presbyopia: Secondary | ICD-10-CM | POA: Diagnosis not present

## 2022-01-09 DIAGNOSIS — I10 Essential (primary) hypertension: Secondary | ICD-10-CM | POA: Diagnosis not present

## 2022-01-09 DIAGNOSIS — Z0000 Encounter for general adult medical examination without abnormal findings: Secondary | ICD-10-CM | POA: Diagnosis not present

## 2022-01-09 DIAGNOSIS — E781 Pure hyperglyceridemia: Secondary | ICD-10-CM | POA: Diagnosis not present

## 2022-01-09 DIAGNOSIS — R7989 Other specified abnormal findings of blood chemistry: Secondary | ICD-10-CM | POA: Diagnosis not present

## 2022-01-09 DIAGNOSIS — E1169 Type 2 diabetes mellitus with other specified complication: Secondary | ICD-10-CM | POA: Diagnosis not present

## 2022-01-09 DIAGNOSIS — Z125 Encounter for screening for malignant neoplasm of prostate: Secondary | ICD-10-CM | POA: Diagnosis not present

## 2022-01-15 DIAGNOSIS — R82998 Other abnormal findings in urine: Secondary | ICD-10-CM | POA: Diagnosis not present

## 2022-01-15 DIAGNOSIS — E1169 Type 2 diabetes mellitus with other specified complication: Secondary | ICD-10-CM | POA: Diagnosis not present

## 2022-01-15 DIAGNOSIS — Z008 Encounter for other general examination: Secondary | ICD-10-CM | POA: Diagnosis not present

## 2022-01-15 DIAGNOSIS — E669 Obesity, unspecified: Secondary | ICD-10-CM | POA: Diagnosis not present

## 2022-01-15 DIAGNOSIS — I10 Essential (primary) hypertension: Secondary | ICD-10-CM | POA: Diagnosis not present

## 2022-01-30 DIAGNOSIS — Z23 Encounter for immunization: Secondary | ICD-10-CM | POA: Diagnosis not present

## 2022-02-13 DIAGNOSIS — Z23 Encounter for immunization: Secondary | ICD-10-CM | POA: Diagnosis not present

## 2022-06-26 DIAGNOSIS — L821 Other seborrheic keratosis: Secondary | ICD-10-CM | POA: Diagnosis not present

## 2022-06-26 DIAGNOSIS — D2262 Melanocytic nevi of left upper limb, including shoulder: Secondary | ICD-10-CM | POA: Diagnosis not present

## 2022-06-26 DIAGNOSIS — D2272 Melanocytic nevi of left lower limb, including hip: Secondary | ICD-10-CM | POA: Diagnosis not present

## 2022-06-26 DIAGNOSIS — D225 Melanocytic nevi of trunk: Secondary | ICD-10-CM | POA: Diagnosis not present

## 2022-06-26 DIAGNOSIS — L814 Other melanin hyperpigmentation: Secondary | ICD-10-CM | POA: Diagnosis not present

## 2022-06-26 DIAGNOSIS — B078 Other viral warts: Secondary | ICD-10-CM | POA: Diagnosis not present

## 2022-06-26 DIAGNOSIS — D2372 Other benign neoplasm of skin of left lower limb, including hip: Secondary | ICD-10-CM | POA: Diagnosis not present

## 2022-06-26 DIAGNOSIS — D485 Neoplasm of uncertain behavior of skin: Secondary | ICD-10-CM | POA: Diagnosis not present

## 2022-06-26 DIAGNOSIS — L570 Actinic keratosis: Secondary | ICD-10-CM | POA: Diagnosis not present

## 2022-06-26 DIAGNOSIS — D2261 Melanocytic nevi of right upper limb, including shoulder: Secondary | ICD-10-CM | POA: Diagnosis not present

## 2022-06-26 DIAGNOSIS — D170 Benign lipomatous neoplasm of skin and subcutaneous tissue of head, face and neck: Secondary | ICD-10-CM | POA: Diagnosis not present

## 2022-06-26 DIAGNOSIS — D2371 Other benign neoplasm of skin of right lower limb, including hip: Secondary | ICD-10-CM | POA: Diagnosis not present

## 2022-07-31 DIAGNOSIS — Z1152 Encounter for screening for COVID-19: Secondary | ICD-10-CM | POA: Diagnosis not present

## 2022-07-31 DIAGNOSIS — J069 Acute upper respiratory infection, unspecified: Secondary | ICD-10-CM | POA: Diagnosis not present

## 2022-07-31 DIAGNOSIS — I10 Essential (primary) hypertension: Secondary | ICD-10-CM | POA: Diagnosis not present

## 2022-07-31 DIAGNOSIS — R051 Acute cough: Secondary | ICD-10-CM | POA: Diagnosis not present

## 2022-07-31 DIAGNOSIS — R0602 Shortness of breath: Secondary | ICD-10-CM | POA: Diagnosis not present

## 2022-07-31 DIAGNOSIS — R5383 Other fatigue: Secondary | ICD-10-CM | POA: Diagnosis not present

## 2022-11-23 DIAGNOSIS — Z1212 Encounter for screening for malignant neoplasm of rectum: Secondary | ICD-10-CM | POA: Diagnosis not present

## 2022-11-23 DIAGNOSIS — I10 Essential (primary) hypertension: Secondary | ICD-10-CM | POA: Diagnosis not present

## 2022-11-23 DIAGNOSIS — E781 Pure hyperglyceridemia: Secondary | ICD-10-CM | POA: Diagnosis not present

## 2022-11-23 DIAGNOSIS — E1169 Type 2 diabetes mellitus with other specified complication: Secondary | ICD-10-CM | POA: Diagnosis not present

## 2023-01-08 DIAGNOSIS — Z23 Encounter for immunization: Secondary | ICD-10-CM | POA: Diagnosis not present

## 2023-01-16 DIAGNOSIS — Z125 Encounter for screening for malignant neoplasm of prostate: Secondary | ICD-10-CM | POA: Diagnosis not present

## 2023-01-16 DIAGNOSIS — I10 Essential (primary) hypertension: Secondary | ICD-10-CM | POA: Diagnosis not present

## 2023-01-16 DIAGNOSIS — E781 Pure hyperglyceridemia: Secondary | ICD-10-CM | POA: Diagnosis not present

## 2023-01-16 DIAGNOSIS — E1169 Type 2 diabetes mellitus with other specified complication: Secondary | ICD-10-CM | POA: Diagnosis not present

## 2023-02-10 DIAGNOSIS — Z1212 Encounter for screening for malignant neoplasm of rectum: Secondary | ICD-10-CM | POA: Diagnosis not present

## 2023-02-11 DIAGNOSIS — Z1339 Encounter for screening examination for other mental health and behavioral disorders: Secondary | ICD-10-CM | POA: Diagnosis not present

## 2023-02-11 DIAGNOSIS — E1169 Type 2 diabetes mellitus with other specified complication: Secondary | ICD-10-CM | POA: Diagnosis not present

## 2023-02-11 DIAGNOSIS — E669 Obesity, unspecified: Secondary | ICD-10-CM | POA: Diagnosis not present

## 2023-02-11 DIAGNOSIS — Z0000 Encounter for general adult medical examination without abnormal findings: Secondary | ICD-10-CM | POA: Diagnosis not present

## 2023-02-11 DIAGNOSIS — I10 Essential (primary) hypertension: Secondary | ICD-10-CM | POA: Diagnosis not present

## 2023-02-11 DIAGNOSIS — Z23 Encounter for immunization: Secondary | ICD-10-CM | POA: Diagnosis not present

## 2023-02-11 DIAGNOSIS — Z1331 Encounter for screening for depression: Secondary | ICD-10-CM | POA: Diagnosis not present

## 2023-02-11 DIAGNOSIS — R82998 Other abnormal findings in urine: Secondary | ICD-10-CM | POA: Diagnosis not present

## 2024-01-08 DIAGNOSIS — H5212 Myopia, left eye: Secondary | ICD-10-CM | POA: Diagnosis not present

## 2024-01-08 DIAGNOSIS — I10 Essential (primary) hypertension: Secondary | ICD-10-CM | POA: Diagnosis not present

## 2024-01-08 DIAGNOSIS — E119 Type 2 diabetes mellitus without complications: Secondary | ICD-10-CM | POA: Diagnosis not present

## 2024-01-08 DIAGNOSIS — H53143 Visual discomfort, bilateral: Secondary | ICD-10-CM | POA: Diagnosis not present

## 2024-02-10 DIAGNOSIS — I10 Essential (primary) hypertension: Secondary | ICD-10-CM | POA: Diagnosis not present

## 2024-02-10 DIAGNOSIS — Z1389 Encounter for screening for other disorder: Secondary | ICD-10-CM | POA: Diagnosis not present

## 2024-02-10 DIAGNOSIS — Z125 Encounter for screening for malignant neoplasm of prostate: Secondary | ICD-10-CM | POA: Diagnosis not present

## 2024-02-10 DIAGNOSIS — Z0189 Encounter for other specified special examinations: Secondary | ICD-10-CM | POA: Diagnosis not present

## 2024-02-10 DIAGNOSIS — E1169 Type 2 diabetes mellitus with other specified complication: Secondary | ICD-10-CM | POA: Diagnosis not present

## 2024-02-10 DIAGNOSIS — Z1212 Encounter for screening for malignant neoplasm of rectum: Secondary | ICD-10-CM | POA: Diagnosis not present

## 2024-02-17 DIAGNOSIS — Z1331 Encounter for screening for depression: Secondary | ICD-10-CM | POA: Diagnosis not present

## 2024-02-17 DIAGNOSIS — L309 Dermatitis, unspecified: Secondary | ICD-10-CM | POA: Diagnosis not present

## 2024-02-17 DIAGNOSIS — Z23 Encounter for immunization: Secondary | ICD-10-CM | POA: Diagnosis not present

## 2024-02-17 DIAGNOSIS — H6992 Unspecified Eustachian tube disorder, left ear: Secondary | ICD-10-CM | POA: Diagnosis not present

## 2024-02-17 DIAGNOSIS — E1169 Type 2 diabetes mellitus with other specified complication: Secondary | ICD-10-CM | POA: Diagnosis not present

## 2024-02-17 DIAGNOSIS — I10 Essential (primary) hypertension: Secondary | ICD-10-CM | POA: Diagnosis not present

## 2024-02-17 DIAGNOSIS — Z0000 Encounter for general adult medical examination without abnormal findings: Secondary | ICD-10-CM | POA: Diagnosis not present

## 2024-02-17 DIAGNOSIS — Z1339 Encounter for screening examination for other mental health and behavioral disorders: Secondary | ICD-10-CM | POA: Diagnosis not present

## 2024-06-03 ENCOUNTER — Ambulatory Visit (HOSPITAL_BASED_OUTPATIENT_CLINIC_OR_DEPARTMENT_OTHER)
Admission: RE | Admit: 2024-06-03 | Discharge: 2024-06-03 | Disposition: A | Discharge status: Home / Self Care | Source: Ambulatory Visit | Attending: Registered Nurse | Admitting: Registered Nurse

## 2024-06-03 ENCOUNTER — Encounter (HOSPITAL_COMMUNITY): Payer: Self-pay

## 2024-06-03 ENCOUNTER — Other Ambulatory Visit: Payer: Self-pay

## 2024-06-03 ENCOUNTER — Observation Stay (HOSPITAL_COMMUNITY)
Admission: EM | Admit: 2024-06-03 | Discharge: 2024-06-06 | Disposition: A | Attending: Emergency Medicine | Admitting: Emergency Medicine

## 2024-06-03 ENCOUNTER — Emergency Department (HOSPITAL_COMMUNITY)

## 2024-06-03 ENCOUNTER — Other Ambulatory Visit: Payer: Self-pay | Admitting: Registered Nurse

## 2024-06-03 DIAGNOSIS — R630 Anorexia: Secondary | ICD-10-CM | POA: Insufficient documentation

## 2024-06-03 DIAGNOSIS — R1024 Suprapubic pain: Secondary | ICD-10-CM

## 2024-06-03 DIAGNOSIS — D72825 Bandemia: Secondary | ICD-10-CM | POA: Insufficient documentation

## 2024-06-03 DIAGNOSIS — R1031 Right lower quadrant pain: Secondary | ICD-10-CM

## 2024-06-03 DIAGNOSIS — R110 Nausea: Secondary | ICD-10-CM | POA: Insufficient documentation

## 2024-06-03 DIAGNOSIS — K810 Acute cholecystitis: Secondary | ICD-10-CM | POA: Diagnosis present

## 2024-06-03 DIAGNOSIS — R1011 Right upper quadrant pain: Principal | ICD-10-CM

## 2024-06-03 DIAGNOSIS — K819 Cholecystitis, unspecified: Secondary | ICD-10-CM

## 2024-06-03 HISTORY — DX: Type 2 diabetes mellitus without complications: E11.9

## 2024-06-03 LAB — CBC WITH DIFFERENTIAL/PLATELET
Abs Immature Granulocytes: 0.12 10*3/uL — ABNORMAL HIGH (ref 0.00–0.07)
Basophils Absolute: 0.1 10*3/uL (ref 0.0–0.1)
Basophils Relative: 0 %
Eosinophils Absolute: 0.1 10*3/uL (ref 0.0–0.5)
Eosinophils Relative: 1 %
HCT: 45.4 % (ref 39.0–52.0)
Hemoglobin: 15.4 g/dL (ref 13.0–17.0)
Immature Granulocytes: 1 %
Lymphocytes Relative: 14 %
Lymphs Abs: 2.3 10*3/uL (ref 0.7–4.0)
MCH: 29.3 pg (ref 26.0–34.0)
MCHC: 33.9 g/dL (ref 30.0–36.0)
MCV: 86.3 fL (ref 80.0–100.0)
Monocytes Absolute: 1.6 10*3/uL — ABNORMAL HIGH (ref 0.1–1.0)
Monocytes Relative: 10 %
Neutro Abs: 12.6 10*3/uL — ABNORMAL HIGH (ref 1.7–7.7)
Neutrophils Relative %: 74 %
Platelets: 207 10*3/uL (ref 150–400)
RBC: 5.26 MIL/uL (ref 4.22–5.81)
RDW: 13.2 % (ref 11.5–15.5)
WBC: 16.8 10*3/uL — ABNORMAL HIGH (ref 4.0–10.5)
nRBC: 0 % (ref 0.0–0.2)

## 2024-06-03 LAB — URINALYSIS, ROUTINE W REFLEX MICROSCOPIC
Bilirubin Urine: NEGATIVE
Glucose, UA: NEGATIVE mg/dL
Hgb urine dipstick: NEGATIVE
Ketones, ur: NEGATIVE mg/dL
Leukocytes,Ua: NEGATIVE
Nitrite: NEGATIVE
Protein, ur: NEGATIVE mg/dL
Specific Gravity, Urine: 1.01 (ref 1.005–1.030)
pH: 5 (ref 5.0–8.0)

## 2024-06-03 LAB — COMPREHENSIVE METABOLIC PANEL WITH GFR
ALT: 17 U/L (ref 0–44)
AST: 15 U/L (ref 15–41)
Albumin: 4.3 g/dL (ref 3.5–5.0)
Alkaline Phosphatase: 58 U/L (ref 38–126)
Anion gap: 17 — ABNORMAL HIGH (ref 5–15)
BUN: 21 mg/dL (ref 8–23)
CO2: 25 mmol/L (ref 22–32)
Calcium: 10.4 mg/dL — ABNORMAL HIGH (ref 8.9–10.3)
Chloride: 96 mmol/L — ABNORMAL LOW (ref 98–111)
Creatinine, Ser: 0.98 mg/dL (ref 0.61–1.24)
GFR, Estimated: >60 mL/min
Glucose, Bld: 166 mg/dL — ABNORMAL HIGH (ref 70–99)
Potassium: 4.1 mmol/L (ref 3.5–5.1)
Sodium: 138 mmol/L (ref 135–145)
Total Bilirubin: 1 mg/dL (ref 0.0–1.2)
Total Protein: 8.1 g/dL (ref 6.5–8.1)

## 2024-06-03 LAB — LIPASE, BLOOD: Lipase: 49 U/L (ref 11–51)

## 2024-06-03 MED ORDER — ONDANSETRON 4 MG PO TBDP
4.0000 mg | ORAL_TABLET | Freq: Once | ORAL | Status: AC
  Administered 2024-06-03: 4 mg via ORAL
  Filled 2024-06-03: qty 1

## 2024-06-03 MED ORDER — IOHEXOL 300 MG/ML  SOLN
100.0000 mL | Freq: Once | INTRAMUSCULAR | Status: AC | PRN
  Administered 2024-06-03: 100 mL via INTRAVENOUS

## 2024-06-03 NOTE — ED Provider Triage Note (Signed)
 Emergency Medicine Provider Triage Evaluation Note  Russell Soto , a 74 y.o. male  was evaluated in triage.  Pt complains of abdominal pain.  Reports abnormal findings on CT and advised to come in for evaluation.  Reportedly was obtaining imaging due to feelings of abdominal discomfort, nausea, and right upper quadrant pain.  There is also some concerns for right lower quadrant pain but CT imaging on chart review shows no signs of appendicitis.  However, is advised, for evaluation due to concerns for swelling and Perry cholecystic fluid around the gallbladder.  He does report pain to this area..  Review of Systems  Positive: As above Negative: As above  Physical Exam  BP (!) 141/76   Pulse (!) 102   Temp 97.6 F (36.4 C)   Resp 16   Ht 5' 8 (1.727 m)   Wt 81.6 kg   SpO2 97%   BMI 27.37 kg/m  Gen:   Awake, no distress   Resp:  Normal effort  MSK:   Moves extremities without difficulty  Other:  Right upper quadrant abdominal pain. No RLQ pain present.  Medical Decision Making  Medically screening exam initiated at 5:36 PM.  Appropriate orders placed.  Russell Soto was informed that the remainder of the evaluation will be completed by another provider, this initial triage assessment does not replace that evaluation, and the importance of remaining in the ED until their evaluation is complete.     Lateka Rady A, PA-C 06/03/24 1738

## 2024-06-03 NOTE — ED Triage Notes (Signed)
 Pt was at physician's office and sent for CT scan at [facility], results were reviewed and they called him to ask him to present to the ED immediately for US  of his abdomen to r/o cholecystitis.

## 2024-06-03 NOTE — ED Notes (Signed)
 When you have time, Selinda Croak (son) [phone] would like an update on pt. Status. Thank you

## 2024-06-04 ENCOUNTER — Emergency Department (HOSPITAL_COMMUNITY)

## 2024-06-04 ENCOUNTER — Encounter (HOSPITAL_COMMUNITY): Payer: Self-pay | Admitting: General Surgery

## 2024-06-04 DIAGNOSIS — K810 Acute cholecystitis: Secondary | ICD-10-CM | POA: Diagnosis present

## 2024-06-04 LAB — CBC WITH DIFFERENTIAL/PLATELET
Abs Immature Granulocytes: 0.08 10*3/uL — ABNORMAL HIGH (ref 0.00–0.07)
Basophils Absolute: 0.1 10*3/uL (ref 0.0–0.1)
Basophils Relative: 0 %
Eosinophils Absolute: 0.1 10*3/uL (ref 0.0–0.5)
Eosinophils Relative: 1 %
HCT: 52.9 % — ABNORMAL HIGH (ref 39.0–52.0)
Hemoglobin: 17.3 g/dL — ABNORMAL HIGH (ref 13.0–17.0)
Immature Granulocytes: 1 %
Lymphocytes Relative: 16 %
Lymphs Abs: 1.9 10*3/uL (ref 0.7–4.0)
MCH: 28.8 pg (ref 26.0–34.0)
MCHC: 32.7 g/dL (ref 30.0–36.0)
MCV: 88 fL (ref 80.0–100.0)
Monocytes Absolute: 1 10*3/uL (ref 0.1–1.0)
Monocytes Relative: 8 %
Neutro Abs: 8.6 10*3/uL — ABNORMAL HIGH (ref 1.7–7.7)
Neutrophils Relative %: 74 %
Platelets: 224 10*3/uL (ref 150–400)
RBC: 6.01 MIL/uL — ABNORMAL HIGH (ref 4.22–5.81)
RDW: 13.5 % (ref 11.5–15.5)
WBC: 11.8 10*3/uL — ABNORMAL HIGH (ref 4.0–10.5)
nRBC: 0 % (ref 0.0–0.2)

## 2024-06-04 LAB — COMPREHENSIVE METABOLIC PANEL WITH GFR
ALT: 16 U/L (ref 0–44)
AST: 22 U/L (ref 15–41)
Albumin: 4.7 g/dL (ref 3.5–5.0)
Alkaline Phosphatase: 65 U/L (ref 38–126)
Anion gap: 16 — ABNORMAL HIGH (ref 5–15)
BUN: 31 mg/dL — ABNORMAL HIGH (ref 8–23)
CO2: 25 mmol/L (ref 22–32)
Calcium: 11.1 mg/dL — ABNORMAL HIGH (ref 8.9–10.3)
Chloride: 94 mmol/L — ABNORMAL LOW (ref 98–111)
Creatinine, Ser: 1.09 mg/dL (ref 0.61–1.24)
GFR, Estimated: >60 mL/min
Glucose, Bld: 178 mg/dL — ABNORMAL HIGH (ref 70–99)
Potassium: 4.3 mmol/L (ref 3.5–5.1)
Sodium: 134 mmol/L — ABNORMAL LOW (ref 135–145)
Total Bilirubin: 1 mg/dL (ref 0.0–1.2)
Total Protein: 9.5 g/dL — ABNORMAL HIGH (ref 6.5–8.1)

## 2024-06-04 LAB — CBG MONITORING, ED: Glucose-Capillary: 145 mg/dL — ABNORMAL HIGH (ref 70–99)

## 2024-06-04 LAB — GLUCOSE, CAPILLARY: Glucose-Capillary: 128 mg/dL — ABNORMAL HIGH (ref 70–99)

## 2024-06-04 MED ORDER — DIPHENHYDRAMINE HCL 50 MG/ML IJ SOLN: 12.5000 mg | Freq: Four times a day (QID) | INTRAMUSCULAR | Status: AC | PRN

## 2024-06-04 MED ORDER — TAMSULOSIN HCL 0.4 MG PO CAPS
0.4000 mg | ORAL_CAPSULE | Freq: Every day | ORAL | Status: DC
  Administered 2024-06-04 – 2024-06-05 (×2): 0.4 mg via ORAL
  Filled 2024-06-04 (×2): qty 1

## 2024-06-04 MED ORDER — METOPROLOL TARTRATE 5 MG/5ML IV SOLN: 5.0000 mg | Freq: Four times a day (QID) | INTRAVENOUS | Status: DC | PRN

## 2024-06-04 MED ORDER — POTASSIUM CHLORIDE IN NACL 20-0.9 MEQ/L-% IV SOLN
INTRAVENOUS | Status: AC
  Filled 2024-06-04: qty 1000

## 2024-06-04 MED ORDER — OXYCODONE HCL 5 MG PO TABS
5.0000 mg | ORAL_TABLET | ORAL | Status: AC | PRN
  Administered 2024-06-05 – 2024-06-06 (×4): 10 mg via ORAL
  Filled 2024-06-04 (×4): qty 2

## 2024-06-04 MED ORDER — HYDRALAZINE HCL 20 MG/ML IJ SOLN: 10.0000 mg | INTRAMUSCULAR | Status: AC | PRN

## 2024-06-04 MED ORDER — ACETAMINOPHEN 500 MG PO TABS
1000.0000 mg | ORAL_TABLET | Freq: Four times a day (QID) | ORAL | Status: AC
  Administered 2024-06-04 – 2024-06-06 (×4): 1000 mg via ORAL
  Filled 2024-06-04 (×5): qty 2

## 2024-06-04 MED ORDER — SODIUM CHLORIDE 0.9 % IV SOLN
2.0000 g | INTRAVENOUS | Status: AC
  Administered 2024-06-05: 2 g via INTRAVENOUS
  Filled 2024-06-04 (×2): qty 20

## 2024-06-04 MED ORDER — SODIUM CHLORIDE 0.9 % IV BOLUS
1000.0000 mL | Freq: Once | INTRAVENOUS | Status: AC
  Administered 2024-06-04: 1000 mL via INTRAVENOUS

## 2024-06-04 MED ORDER — SIMETHICONE 80 MG PO CHEW: 40.0000 mg | CHEWABLE_TABLET | Freq: Four times a day (QID) | ORAL | Status: DC | PRN

## 2024-06-04 MED ORDER — ONDANSETRON HCL 4 MG/2ML IJ SOLN: 4.0000 mg | Freq: Four times a day (QID) | INTRAMUSCULAR | Status: DC | PRN

## 2024-06-04 MED ORDER — HYDROCHLOROTHIAZIDE 25 MG PO TABS
25.0000 mg | ORAL_TABLET | Freq: Every day | ORAL | Status: AC
  Administered 2024-06-05 – 2024-06-06 (×2): 25 mg via ORAL
  Filled 2024-06-04 (×3): qty 1

## 2024-06-04 MED ORDER — VALSARTAN-HYDROCHLOROTHIAZIDE 320-25 MG PO TABS: 1.0000 | ORAL_TABLET | Freq: Every day | ORAL | Status: DC

## 2024-06-04 MED ORDER — ENOXAPARIN SODIUM 40 MG/0.4ML IJ SOSY
40.0000 mg | PREFILLED_SYRINGE | INTRAMUSCULAR | Status: DC
  Administered 2024-06-04: 40 mg via SUBCUTANEOUS
  Filled 2024-06-04: qty 0.4

## 2024-06-04 MED ORDER — MORPHINE SULFATE (PF) 2 MG/ML IV SOLN: 1.0000 mg | INTRAVENOUS | Status: AC | PRN

## 2024-06-04 MED ORDER — MORPHINE SULFATE (PF) 4 MG/ML IV SOLN
3.0000 mg | Freq: Once | INTRAVENOUS | Status: AC
  Administered 2024-06-04: 3 mg via INTRAVENOUS
  Filled 2024-06-04: qty 1

## 2024-06-04 MED ORDER — IRBESARTAN 300 MG PO TABS
300.0000 mg | ORAL_TABLET | Freq: Every day | ORAL | Status: AC
  Administered 2024-06-05 – 2024-06-06 (×2): 300 mg via ORAL
  Filled 2024-06-04 (×3): qty 1

## 2024-06-04 MED ORDER — DIPHENHYDRAMINE HCL 12.5 MG/5ML PO ELIX: 12.5000 mg | ORAL_SOLUTION | Freq: Four times a day (QID) | ORAL | Status: AC | PRN

## 2024-06-04 MED ORDER — MELATONIN 3 MG PO TABS: 3.0000 mg | ORAL_TABLET | Freq: Every evening | ORAL | Status: DC | PRN

## 2024-06-04 MED ORDER — INSULIN ASPART 100 UNIT/ML IJ SOLN
0.0000 [IU] | Freq: Three times a day (TID) | INTRAMUSCULAR | Status: AC
  Administered 2024-06-05: 3 [IU] via SUBCUTANEOUS
  Administered 2024-06-05: 11 [IU] via SUBCUTANEOUS
  Administered 2024-06-06: 3 [IU] via SUBCUTANEOUS
  Filled 2024-06-04: qty 11
  Filled 2024-06-04 (×2): qty 3

## 2024-06-04 MED ORDER — ONDANSETRON 4 MG PO TBDP: 4.0000 mg | ORAL_TABLET | Freq: Four times a day (QID) | ORAL | Status: DC | PRN

## 2024-06-04 MED ORDER — TECHNETIUM TC 99M MEBROFENIN IV KIT
5.0000 | PACK | Freq: Once | INTRAVENOUS | Status: AC | PRN
  Administered 2024-06-04: 5.11 via INTRAVENOUS

## 2024-06-04 MED ORDER — ONDANSETRON HCL 4 MG/2ML IJ SOLN
4.0000 mg | Freq: Once | INTRAMUSCULAR | Status: AC
  Administered 2024-06-04: 4 mg via INTRAVENOUS
  Filled 2024-06-04: qty 2

## 2024-06-04 MED ORDER — SODIUM CHLORIDE 0.9 % IV SOLN
1.0000 g | Freq: Once | INTRAVENOUS | Status: AC
  Administered 2024-06-04: 1 g via INTRAVENOUS
  Filled 2024-06-04: qty 10

## 2024-06-04 NOTE — ED Notes (Signed)
Transported to Nuclear medicine

## 2024-06-04 NOTE — H&P (Signed)
 "    Russell Soto Apr 22, 1951  [id number].    Chief Complaint/Reason for Consult: cholecystitis  HPI:  This is a 74 yo male with a history of HTN and DM who presented to his PCP office yesterday with 3 days of epigastric abdominal pain with N/V.  He had spaghetti and meatballs 3 days ago and then started having symptoms that have not gone away.  His CT that he got yesterday as an outpatient revealed a distended gallbladder but no other findings of cholecystitis.  Due to persistent symptoms, he was referred to the ED today.  He underwent an US  that revealed no gallstones, wall thickening, pericholecystic fluid, but gallbladder distention.  His WBC was 16K yesterday, but down to 11K today.  His LFTs are normal.  A HIDA show cholecystitis  ROS: Review of Systems  All other systems reviewed and are negative. : see HPI  Family History  Problem Relation Age of Onset   Diabetes Mother    Stroke Mother     Past Medical History:  Diagnosis Date   Blood transfusion 1997   BPH (benign prostatic hyperplasia)    Colon polyps 06/2011   Tubular adenomas polyps   Diverticulitis    Diverticulosis    Elbow fracture, right    Ulcer 1997   gastric/bleeding    History reviewed. No pertinent surgical history.  Social History:  reports that he quit smoking about 29 years ago. He has never used smokeless tobacco. He reports that he does not drink alcohol  and does not use drugs.  Allergies: Allergies[1]  (Not in a hospital admission)    Physical Exam: Blood pressure 135/75, pulse 69, temperature 98 F (36.7 C), temperature source Oral, resp. rate 16, height 5' 8 (1.727 m), weight 81.6 kg, SpO2 100%. General: pleasant, WD, WN male who is laying in bed in NAD HEENT: head is normocephalic, atraumatic.  Sclera are noninjected.  PERRL.  Ears and nose without any masses or lesions.  Mouth is pink and moist Heart: regular, rate, and rhythm.   Lungs: CTAB Respiratory effort nonlabored Abd: soft,  tender ruq, ND, +BS, no masses, hernias, or organomegaly Psych: A&Ox3 with an appropriate affect.   Results for orders placed or performed during the hospital encounter of 06/03/24 (from the past 48 hours)  Urinalysis, Routine w reflex microscopic -Urine, Clean Catch     Status: None   Collection Time: 06/03/24  6:10 PM  Result Value Ref Range   Color, Urine YELLOW YELLOW   APPearance CLEAR CLEAR   Specific Gravity, Urine 1.010 1.005 - 1.030   pH 5.0 5.0 - 8.0   Glucose, UA NEGATIVE NEGATIVE mg/dL   Hgb urine dipstick NEGATIVE NEGATIVE   Bilirubin Urine NEGATIVE NEGATIVE   Ketones, ur NEGATIVE NEGATIVE mg/dL   Protein, ur NEGATIVE NEGATIVE mg/dL   Nitrite NEGATIVE NEGATIVE   Leukocytes,Ua NEGATIVE NEGATIVE    Comment: Microscopic not done on urines with negative protein, blood, leukocytes, nitrite, or glucose < 500 mg/dL. Performed at [facility], [address], [place], [place] [address]   CBC with Differential     Status: Abnormal   Collection Time: 06/03/24  6:35 PM  Result Value Ref Range   WBC 16.8 (H) 4.0 - 10.5 K/uL   RBC 5.26 4.22 - 5.81 MIL/uL   Hemoglobin 15.4 13.0 - 17.0 g/dL   HCT 54.5 60.9 - 47.9 %   MCV 86.3 80.0 - 100.0 fL   MCH 29.3 26.0 - 34.0 pg   MCHC  33.9 30.0 - 36.0 g/dL   RDW 86.7 88.4 - 84.4 %   Platelets 207 150 - 400 K/uL   nRBC 0.0 0.0 - 0.2 %   Neutrophils Relative % 74 %   Neutro Abs 12.6 (H) 1.7 - 7.7 K/uL   Lymphocytes Relative 14 %   Lymphs Abs 2.3 0.7 - 4.0 K/uL   Monocytes Relative 10 %   Monocytes Absolute 1.6 (H) 0.1 - 1.0 K/uL   Eosinophils Relative 1 %   Eosinophils Absolute 0.1 0.0 - 0.5 K/uL   Basophils Relative 0 %   Basophils Absolute 0.1 0.0 - 0.1 K/uL   Immature Granulocytes 1 %   Abs Immature Granulocytes 0.12 (H) 0.00 - 0.07 K/uL    Comment: Performed at [facility], [address], [place], [place] [address]  Comprehensive metabolic panel     Status: Abnormal   Collection Time: 06/03/24  6:35 PM  Result  Value Ref Range   Sodium 138 135 - 145 mmol/L   Potassium 4.1 3.5 - 5.1 mmol/L   Chloride 96 (L) 98 - 111 mmol/L   CO2 25 22 - 32 mmol/L   Glucose, Bld 166 (H) 70 - 99 mg/dL    Comment: Glucose reference range applies only to samples taken after fasting for at least 8 hours.   BUN 21 8 - 23 mg/dL   Creatinine, Ser 9.01 0.61 - 1.24 mg/dL   Calcium 89.5 (H) 8.9 - 10.3 mg/dL   Total Protein 8.1 6.5 - 8.1 g/dL   Albumin 4.3 3.5 - 5.0 g/dL   AST 15 15 - 41 U/L   ALT 17 0 - 44 U/L   Alkaline Phosphatase 58 38 - 126 U/L   Total Bilirubin 1.0 0.0 - 1.2 mg/dL   GFR, Estimated >39 >39 mL/min    Comment: (NOTE) Calculated using the CKD-EPI Creatinine Equation (2021)    Anion gap 17 (H) 5 - 15    Comment: Performed at [facility], [address], [place], [place] [address]  Lipase, blood     Status: None   Collection Time: 06/03/24  6:35 PM  Result Value Ref Range   Lipase 49 11 - 51 U/L    Comment: Performed at [facility], [address] [place], [place] [address]  CBC with Differential     Status: Abnormal   Collection Time: 06/04/24 12:11 PM  Result Value Ref Range   WBC 11.8 (H) 4.0 - 10.5 K/uL   RBC 6.01 (H) 4.22 - 5.81 MIL/uL   Hemoglobin 17.3 (H) 13.0 - 17.0 g/dL   HCT 47.0 (H) 60.9 - 47.9 %   MCV 88.0 80.0 - 100.0 fL   MCH 28.8 26.0 - 34.0 pg   MCHC 32.7 30.0 - 36.0 g/dL   RDW 86.4 88.4 - 84.4 %   Platelets 224 150 - 400 K/uL   nRBC 0.0 0.0 - 0.2 %   Neutrophils Relative % 74 %   Neutro Abs 8.6 (H) 1.7 - 7.7 K/uL   Lymphocytes Relative 16 %   Lymphs Abs 1.9 0.7 - 4.0 K/uL   Monocytes Relative 8 %   Monocytes Absolute 1.0 0.1 - 1.0 K/uL   Eosinophils Relative 1 %   Eosinophils Absolute 0.1 0.0 - 0.5 K/uL   Basophils Relative 0 %   Basophils Absolute 0.1 0.0 - 0.1 K/uL   Immature Granulocytes 1 %   Abs Immature Granulocytes 0.08 (H) 0.00 - 0.07 K/uL  Comment: Performed at [facility], [address], [place], [place] [address]  Comprehensive  metabolic panel     Status: Abnormal   Collection Time: 06/04/24 12:11 PM  Result Value Ref Range   Sodium 134 (L) 135 - 145 mmol/L   Potassium 4.3 3.5 - 5.1 mmol/L    Comment: HEMOLYSIS AT THIS LEVEL MAY AFFECT RESULT   Chloride 94 (L) 98 - 111 mmol/L   CO2 25 22 - 32 mmol/L   Glucose, Bld 178 (H) 70 - 99 mg/dL    Comment: Glucose reference range applies only to samples taken after fasting for at least 8 hours.   BUN 31 (H) 8 - 23 mg/dL   Creatinine, Ser 8.90 0.61 - 1.24 mg/dL   Calcium 88.8 (H) 8.9 - 10.3 mg/dL   Total Protein 9.5 (H) 6.5 - 8.1 g/dL   Albumin 4.7 3.5 - 5.0 g/dL   AST 22 15 - 41 U/L    Comment: HEMOLYSIS AT THIS LEVEL MAY AFFECT RESULT   ALT 16 0 - 44 U/L   Alkaline Phosphatase 65 38 - 126 U/L   Total Bilirubin 1.0 0.0 - 1.2 mg/dL   GFR, Estimated >39 >39 mL/min    Comment: (NOTE) Calculated using the CKD-EPI Creatinine Equation (2021)    Anion gap 16 (H) 5 - 15    Comment: Performed at [facility], [address], [place], [place] [address]   US  Abdomen Limited RUQ (LIVER/GB) Result Date: 06/03/2024 EXAM: Right Upper Quadrant Abdominal Ultrasound 06/03/2024 06:22:00 PM TECHNIQUE: Real-time ultrasonography of the right upper quadrant of the abdomen was performed. COMPARISON: CT of the abdomen and pelvis dated 06/03/2024. CLINICAL HISTORY: Right upper quadrant pain. FINDINGS: LIVER: Hepatic steatosis. No intrahepatic biliary ductal dilatation. No evidence of mass. Hepatopetal flow in the portal vein. BILIARY SYSTEM: Fluid filled, distended gallbladder. Curvilinear hypoechogenicity along the hepatic surface of the gallbladder wall. Negative sonographic Murphy's sign. No cholelithiasis. No pericholecystic fluid. No changes of acute cholecystitis. Common bile duct is within normal limits measuring 5 mm. OTHER: No right upper quadrant ascites. IMPRESSION: 1. Fluid-filled and distended gallbladder without cholecystolithiasis. Curvilinear hypoechogenicity along the  hepatic surface of the gallbladder wall, which may represent a small amount of pericholecystic fluid or focal fatty sparing. Given the lack of a sonographic murphy's sign and gallbladder wall thickening, the ultrasound is equivocal for acute cholecystitis. Given the findings on the recent CT, a nuclear medicine hepatobiliary scan is recommended for further evaluation. 2. Diffuse hepatic steatosis. Electronically signed by: Rogelia Myers MD 06/03/2024 06:30 PM EST RP Workstation: [id number]   CT ABDOMEN PELVIS W CONTRAST Result Date: 06/03/2024 CLINICAL DATA:  Right lower quadrant pain nausea EXAM: CT ABDOMEN AND PELVIS WITH CONTRAST TECHNIQUE: Multidetector CT imaging of the abdomen and pelvis was performed using the standard protocol following bolus administration of intravenous contrast. RADIATION DOSE REDUCTION: This exam was performed according to the departmental dose-optimization program which includes automated exposure control, adjustment of the mA and/or kV according to patient size and/or use of iterative reconstruction technique. CONTRAST:  OMNIPAQUE  IOHEXOL  300 MG/ML  SOLN COMPARISON:  CT 08/03/2013 FINDINGS: Lower chest: Chronic elevation of the right diaphragm with mild atelectasis at the right base. No acute airspace disease. Mild coronary vascular calcification. Hepatobiliary: Hepatic steatosis. Distended gallbladder with possible wall thickening versus trace pericholecystic fluid and equivocal right upper quadrant stranding. Common bile duct diameter up to 8 mm. Pancreas: Unremarkable. No pancreatic ductal dilatation or surrounding inflammatory changes. Spleen: Normal  in size without focal abnormality. Adrenals/Urinary Tract: Adrenal glands are within normal limits. Kidneys show no hydronephrosis. Subcentimeter hypodensities too small to further characterize. The bladder is slightly thick walled. Stomach/Bowel: The stomach is within normal limits. Small duodenal diverticulum. No acute  bowel wall thickening. Negative appendix. Diverticular disease of the colon Vascular/Lymphatic: Aortic atherosclerosis. No enlarged abdominal or pelvic lymph nodes. Retroaortic left renal vein Reproductive: Enlarged prostate Other: No ascites or free air. Small fat containing right inguinal hernia Musculoskeletal: No acute osseous abnormality. Degenerative changes at L3-L4. IMPRESSION: 1. Distended gallbladder with possible wall thickening versus trace pericholecystic fluid and equivocal right upper quadrant stranding. Recommend correlation with right upper quadrant ultrasound to assess for possible cholecystitis. CBD upper normal to borderline enlarged, correlate with LFTs 2. Hepatic steatosis. 3. Diverticular disease of the colon without acute inflammatory process. 4. Enlarged prostate. Slightly thick-walled bladder either due to chronic outlet obstruction or cystitis. 5. Aortic atherosclerosis. These results will be called to the ordering clinician or representative by the Radiologist Assistant, and communication documented in the PACS or [employer]. Aortic Atherosclerosis (ICD10-I70.0). Electronically Signed   By: Luke Bun M.D.   On: 06/03/2024 15:34      Assessment/Plan Acalculous cholecystitis I discussed the procedure in detail.  We discussed the risks and benefits of a laparoscopic cholecystectomy and possible cholangiogram including, but not limited to bleeding, infection, injury to surrounding structures such as the intestine or liver, bile leak, retained gallstones, need to convert to an open procedure, prolonged diarrhea, blood clots such as  DVT, common bile duct injury, anesthesia risks, and possible need for additional procedures. We also discussed small risk of subtotal cholecystectomy.  The likelihood of improvement in symptoms and return to the patient's normal status is good. We discussed the typical post-operative recovery course.  -I do think symptoms are from gb. Will do  surgery in am  FEN - CLD, NPO p MN/IVFs VTE - Lovenox  ID - Rocephin  Admit - obs, med-surg, OR tomorrow  HTN - home meds ordered DM - hold home metformin, SSI       [1] No Known Allergies  "

## 2024-06-04 NOTE — ED Provider Notes (Signed)
 " [place] EMERGENCY DEPARTMENT AT [facility] Provider Note   CSN: [id number] Arrival date & time: 06/03/24  1626     Patient presents with: Abdominal Pain   Russell Soto is a 74 y.o. male.   73 y.o male with a PMH of DM, BPH, Diverticulitis presents to the ED with a chief complaint of nausea, vomiting and epigastric pain x 3 days. Patient reports having spaghetti and meatballs on Monday night, states that after this meal he felt very nauseated, did have 1 episode of nonbilious, nonbloody emesis.  Reports feeling a burning sensation to his lower abdomen bleed.  He was evaluated by his PCP yesterday who ordered a CT that was inconclusive.  Reports he was sent to the emergency department as patient felt that his symptoms were worsening.  His last oral intake was Tuesday with a fair life protein shake.  Although, he did drink some [facility] this morning.  He reports lack of appetite.  There has not been any worsening episodes of vomiting at this time.  He denies any fever, blood in his stool, other complaints reported.  The history is provided by the patient.  Abdominal Pain Pain location:  Epigastric and RUQ Pain quality: aching   Pain radiates to:  Does not radiate Pain severity:  Moderate Onset quality:  Gradual Duration:  3 days Timing:  Constant Progression:  Worsening Chronicity:  New Context: not diet changes, not eating and not laxative use   Associated symptoms: nausea and vomiting   Associated symptoms: no chest pain, no chills, no constipation, no diarrhea, no fever, no shortness of breath and no sore throat        Prior to Admission medications  Medication Sig Start Date End Date Taking? Authorizing Provider  amoxicillin -clavulanate (AUGMENTIN ) 875-125 MG tablet Take 1 tablet by mouth 2 (two) times daily. 06/06/24  Yes Vernetta Berg, MD  Ascorbic Acid (VITAMIN C) 500 MG CAPS Take 500 mg by mouth daily.   Yes [provider]  aspirin 325 MG EC  tablet Take 325 mg by mouth daily.   Yes [provider]  Cyanocobalamin (VITAMIN B 12 PO) Take 1 tablet by mouth daily.   Yes [provider]  fish oil-omega-3 fatty acids 1000 MG capsule Take 1 g by mouth daily.   Yes [provider]  Flaxseed, Linseed, (FLAX SEED OIL PO) Take 1 tablet by mouth daily.   Yes [provider]  metFORMIN (GLUCOPHAGE) 500 MG tablet Take 1,000 mg by mouth in the morning and at bedtime. 11/23/21  Yes [provider]  Multiple Vitamins-Minerals (MULTIVITAMIN WITH MINERALS) tablet Take 1 tablet by mouth daily.   Yes [provider]  oxyCODONE  (OXY IR/ROXICODONE ) 5 MG immediate release tablet Take 1 tablet (5 mg total) by mouth every 6 (six) hours as needed for moderate pain (pain score 4-6) or severe pain (pain score 7-10). 06/06/24  Yes Vernetta Berg, MD  Tamsulosin  HCl (FLOMAX ) 0.4 MG CAPS Take 0.4 mg by mouth See admin instructions. Take 0.4 mg by mouth at dinner and then take 0.4 mg again at bedtime 05/09/11  Yes [provider]  valsartan -hydrochlorothiazide  (DIOVAN -HCT) 320-25 MG tablet Take 1 tablet by mouth daily. 11/17/21  Yes [provider]    Allergies: Patient has no known allergies.    Review of Systems  Constitutional:  Negative for chills and fever.  HENT:  Negative for sore throat.   Respiratory:  Negative for shortness of breath.   Cardiovascular:  Negative for chest pain.  Gastrointestinal:  Positive for abdominal pain, nausea and vomiting. Negative for constipation and diarrhea.  Genitourinary:  Negative for flank pain.  All other systems reviewed and are negative.   Updated Vital Signs BP (!) 141/64 (BP Location: Right Arm)   Pulse 100   Temp 98.6 F (37 C)   Resp 17   Ht (P) 5' 8 (1.727 m)   Wt (P) 81.6 kg   SpO2 94%   BMI (P) 27.37 kg/m   Physical Exam Vitals and nursing note reviewed.  Constitutional:      Appearance: He is well-developed.  HENT:     Head:  Normocephalic and atraumatic.  Cardiovascular:     Rate and Rhythm: Normal rate.  Pulmonary:     Effort: Pulmonary effort is normal.     Breath sounds: No wheezing or rales.  Abdominal:     General: Abdomen is flat. Bowel sounds are normal.     Palpations: Abdomen is soft.     Tenderness: There is abdominal tenderness in the right upper quadrant and epigastric area. There is no right CVA tenderness or left CVA tenderness. Positive signs include Murphy's sign.  Skin:    General: Skin is warm and dry.  Neurological:     Mental Status: He is alert and oriented to person, place, and time.     (all labs ordered are listed, but only abnormal results are displayed) Labs Reviewed  CBC WITH DIFFERENTIAL/PLATELET - Abnormal; Notable for the following components:      Result Value   WBC 16.8 (*)    Neutro Abs 12.6 (*)    Monocytes Absolute 1.6 (*)    Abs Immature Granulocytes 0.12 (*)    All other components within normal limits  COMPREHENSIVE METABOLIC PANEL WITH GFR - Abnormal; Notable for the following components:   Chloride 96 (*)    Glucose, Bld 166 (*)    Calcium 10.4 (*)    Anion gap 17 (*)    All other components within normal limits  CBC WITH DIFFERENTIAL/PLATELET - Abnormal; Notable for the following components:   WBC 11.8 (*)    RBC 6.01 (*)    Hemoglobin 17.3 (*)    HCT 52.9 (*)    Neutro Abs 8.6 (*)    Abs Immature Granulocytes 0.08 (*)    All other components within normal limits  COMPREHENSIVE METABOLIC PANEL WITH GFR - Abnormal; Notable for the following components:   Sodium 134 (*)    Chloride 94 (*)    Glucose, Bld 178 (*)    BUN 31 (*)    Calcium 11.1 (*)    Total Protein 9.5 (*)    Anion gap 16 (*)    All other components within normal limits  HEMOGLOBIN A1C - Abnormal; Notable for the following components:   Hgb A1c MFr Bld 6.8 (*)    All other components within normal limits  COMPREHENSIVE METABOLIC PANEL WITH GFR - Abnormal; Notable for the following  components:   Glucose, Bld 136 (*)    Total Protein 6.3 (*)    Albumin 3.4 (*)    AST 12 (*)    All other components within normal limits  GLUCOSE, CAPILLARY - Abnormal; Notable for the following components:   Glucose-Capillary 128 (*)    All other components within normal limits  GLUCOSE, CAPILLARY - Abnormal; Notable for the following components:   Glucose-Capillary 140 (*)    All other components within normal limits  GLUCOSE, CAPILLARY -  Abnormal; Notable for the following components:   Glucose-Capillary 186 (*)    All other components within normal limits  GLUCOSE, CAPILLARY - Abnormal; Notable for the following components:   Glucose-Capillary 196 (*)    All other components within normal limits  GLUCOSE, CAPILLARY - Abnormal; Notable for the following components:   Glucose-Capillary 314 (*)    All other components within normal limits  GLUCOSE, CAPILLARY - Abnormal; Notable for the following components:   Glucose-Capillary 169 (*)    All other components within normal limits  GLUCOSE, CAPILLARY - Abnormal; Notable for the following components:   Glucose-Capillary 179 (*)    All other components within normal limits  CBG MONITORING, ED - Abnormal; Notable for the following components:   Glucose-Capillary 145 (*)    All other components within normal limits  SURGICAL PCR SCREEN  LIPASE, BLOOD  URINALYSIS, ROUTINE W REFLEX MICROSCOPIC  CBC  SURGICAL PATHOLOGY    EKG: None  Radiology: No results found.    Procedures   Medications Ordered in the ED  0.9 % NaCl with KCl 20 mEq/ L  infusion (0 mL/hr Intravenous Stopped 06/06/24 0859)  chlorhexidine  (PERIDEX ) 0.12 % solution (has no administration in time range)  ondansetron  (ZOFRAN -ODT) disintegrating tablet 4 mg (4 mg Oral Given 06/03/24 1737)  cefTRIAXone  (ROCEPHIN ) 1 g in sodium chloride  0.9 % 100 mL IVPB (0 g Intravenous Stopped 06/04/24 1229)  sodium chloride  0.9 % bolus 1,000 mL (0 mLs Intravenous Stopped 06/04/24  1318)  ondansetron  (ZOFRAN ) injection 4 mg (4 mg Intravenous Given 06/04/24 1159)  technetium TC 99M  mebrofenin  (CHOLETEC ) injection 5 millicurie (5.11 millicuries Intravenous Contrast Given 06/04/24 1320)  morphine  (PF) 4 MG/ML injection 3 mg (3 mg Intravenous Given 06/04/24 1440)  Spy Agent Green / Firefly Optime (1.25 mg Intravenous Given 06/05/24 0722)  chlorhexidine  (PERIDEX ) 0.12 % solution 15 mL ( Mouth/Throat See Alternative 06/05/24 0727)    Or  Oral care mouth rinse (15 mLs Mouth Rinse Given 06/05/24 0727)    Clinical Course as of 06/10/24 0926  Thu Jun 04, 2024  1105 WBC(!): 16.8 [JS]  1105 Anion gap(!): 17 [JS]  1432 WBC(!): 11.8 Improved from last night [JS]  1528 S - Abdominal pain since Monday. Got HIDA, general surgery consulted.  [DZ]    Clinical Course User Index [DZ] Laurita Sieving, MD [JS] Oriah Leinweber, PA-C                                 Medical Decision Making Amount and/or Complexity of Data Reviewed Labs: ordered. Decision-making details documented in ED Course.  Risk Prescription drug management. Decision regarding hospitalization.    This patient presents to the ED for concern of abdominal pain, this involves a number of treatment options, and is a complaint that carries with it a high risk of complications and morbidity.  The differential diagnosis includes cholecystitis, appendicitis, obstruction.   Co morbidities: Discussed in HPI   Brief History:  See HPI.   EMR reviewed including pt PMHx, past surgical history and past visits to ER.   See HPI for more details   Lab Tests:  I ordered and independently interpreted labs.  The pertinent results include:    CBC from yesterday around 6 PM was remarkable for a leukocytosis of 16.8, on repeat today this has now improved to 11.8.  CMP with no electrolyte derangement, creatinine levels unremarkable.  LFTs are within normal limits.  Urinalysis  without any signs of infection.  Imaging  Studies:  Ultrasound RUQ showed: IMPRESSION:  1. Fluid-filled and distended gallbladder without cholecystolithiasis.  Curvilinear hypoechogenicity along the hepatic surface of the gallbladder wall,  which may represent a small amount of pericholecystic fluid or focal fatty  sparing. Given the lack of a sonographic murphy's sign and gallbladder wall  thickening, the ultrasound is equivocal for acute cholecystitis. Given the  findings on the recent CT, a nuclear medicine hepatobiliary scan is recommended  for further evaluation.  2. Diffuse hepatic steatosis.    Medicines ordered:  I ordered medication including rocephin   for intra abdominal pathology Reevaluation of the patient after these medicines showed that the patient stayed the same I have reviewed the patients home medicines and have made adjustments as needed  Consults:  I requested consultation with general surgery Burnard APP,  and discussed lab and imaging findings as well as pertinent plan - they recommend: repeat labs , HIDA scan and will evaluate patient for further recommendations.   Reevaluation:  After the interventions noted above I re-evaluated patient and found that they have :stayed the same  Social Determinants of Health:  The patient's social determinants of health were a factor in the care of this patient  Problem List / ED Course:  Patient present to the ED with a chief complaint of abdominal pain that is been ongoing for the past 3 days.  Reports something that he had meatballs and spaghetti and attributes this to his food.  Reports he had an episode of nonbilious, nonbloody emesis throughout the night.  He continued to endorse pain the next day.  Followed up with PCP on Wednesday who obtained a CT that was inconclusive.  He was sent to the ED due to decrease in oral intake with his last meal being on Tuesday.  Continues to endorse pain along the right upper quadrant and epigastric region.  He has not taken  any other medication to help with improvement.  He did have some Mountain Dew this morning.  He has not been running fevers however yesterday's blood work did show a leukocytosis of 16.8. Live enzymes are within normal limits.  There is no concern for acute cholecystitis, ultrasound obtained does show some fluid filled and gallbladder is distended.  I discussed this case with APP Burnard from general surgery who recommended HIDA scan prior to proceeding with a cholecystectomy.  I discussed this with patient, HIDA scan was ordered in the emergency department.  In the meantime, patient was covered with Rocephin  for intra-abdominal pathology.   Dispostion:  Patient care signed out to incoming team pending HIDA scan results.    Portions of this note were generated with Scientist, clinical (histocompatibility and immunogenetics). Dictation errors may occur despite best attempts at proofreading.   Final diagnoses:  RUQ abdominal pain  Cholecystitis    ED Discharge Orders          Ordered    oxyCODONE  (OXY IR/ROXICODONE ) 5 MG immediate release tablet  Every 6 hours PRN        06/06/24 0808    amoxicillin -clavulanate (AUGMENTIN ) 875-125 MG tablet  2 times daily        06/06/24 0808               Ciji Boston, PA-C 06/10/24 9073  "

## 2024-06-05 ENCOUNTER — Observation Stay (HOSPITAL_COMMUNITY): Admitting: Anesthesiology

## 2024-06-05 ENCOUNTER — Encounter (HOSPITAL_COMMUNITY): Payer: Self-pay

## 2024-06-05 ENCOUNTER — Encounter (HOSPITAL_COMMUNITY)
Admission: EM | Disposition: A | Discharge status: Home / Self Care | Payer: Self-pay | Source: Home / Self Care | Attending: Emergency Medicine

## 2024-06-05 DIAGNOSIS — K819 Cholecystitis, unspecified: Secondary | ICD-10-CM

## 2024-06-05 LAB — COMPREHENSIVE METABOLIC PANEL WITH GFR
ALT: 10 U/L (ref 0–44)
AST: 12 U/L — ABNORMAL LOW (ref 15–41)
Albumin: 3.4 g/dL — ABNORMAL LOW (ref 3.5–5.0)
Alkaline Phosphatase: 46 U/L (ref 38–126)
Anion gap: 8 (ref 5–15)
BUN: 23 mg/dL (ref 8–23)
CO2: 26 mmol/L (ref 22–32)
Calcium: 9.3 mg/dL (ref 8.9–10.3)
Chloride: 103 mmol/L (ref 98–111)
Creatinine, Ser: 0.81 mg/dL (ref 0.61–1.24)
GFR, Estimated: >60 mL/min
Glucose, Bld: 136 mg/dL — ABNORMAL HIGH (ref 70–99)
Potassium: 4 mmol/L (ref 3.5–5.1)
Sodium: 138 mmol/L (ref 135–145)
Total Bilirubin: 0.8 mg/dL (ref 0.0–1.2)
Total Protein: 6.3 g/dL — ABNORMAL LOW (ref 6.5–8.1)

## 2024-06-05 LAB — GLUCOSE, CAPILLARY
Glucose-Capillary: 140 mg/dL — ABNORMAL HIGH (ref 70–99)
Glucose-Capillary: 186 mg/dL — ABNORMAL HIGH (ref 70–99)
Glucose-Capillary: 196 mg/dL — ABNORMAL HIGH (ref 70–99)
Glucose-Capillary: 314 mg/dL — ABNORMAL HIGH (ref 70–99)
Glucose-Capillary: 169 mg/dL — ABNORMAL HIGH (ref 70–99)

## 2024-06-05 LAB — CBC
HCT: 39.4 % (ref 39.0–52.0)
Hemoglobin: 13 g/dL (ref 13.0–17.0)
MCH: 28.8 pg (ref 26.0–34.0)
MCHC: 33 g/dL (ref 30.0–36.0)
MCV: 87.2 fL (ref 80.0–100.0)
Platelets: 161 10*3/uL (ref 150–400)
RBC: 4.52 MIL/uL (ref 4.22–5.81)
RDW: 13.2 % (ref 11.5–15.5)
WBC: 9 10*3/uL (ref 4.0–10.5)
nRBC: 0 % (ref 0.0–0.2)

## 2024-06-05 LAB — SURGICAL PCR SCREEN
MRSA, PCR: NEGATIVE
Staphylococcus aureus: NEGATIVE

## 2024-06-05 LAB — HEMOGLOBIN A1C
Hgb A1c MFr Bld: 6.8 % — ABNORMAL HIGH (ref 4.8–5.6)
Mean Plasma Glucose: 148.46 mg/dL

## 2024-06-05 LAB — POCT I-STAT CREATININE: Creatinine, Ser: 1.1 mg/dL (ref 0.61–1.24)

## 2024-06-05 MED ORDER — PROPOFOL 10 MG/ML IV BOLUS
INTRAVENOUS | Status: AC
  Filled 2024-06-05: qty 20

## 2024-06-05 MED ORDER — SUGAMMADEX SODIUM 200 MG/2ML IV SOLN
INTRAVENOUS | Status: DC | PRN
  Administered 2024-06-05: 200 mg via INTRAVENOUS

## 2024-06-05 MED ORDER — LIDOCAINE 2% (20 MG/ML) 5 ML SYRINGE
INTRAMUSCULAR | Status: DC | PRN
  Administered 2024-06-05: 80 mg via INTRAVENOUS

## 2024-06-05 MED ORDER — FENTANYL CITRATE (PF) 100 MCG/2ML IJ SOLN
INTRAMUSCULAR | Status: AC
  Filled 2024-06-05: qty 2

## 2024-06-05 MED ORDER — BUPIVACAINE-EPINEPHRINE 0.25% -1:200000 IJ SOLN
INTRAMUSCULAR | Status: DC | PRN
  Administered 2024-06-05: 9 mL

## 2024-06-05 MED ORDER — ENOXAPARIN SODIUM 40 MG/0.4ML IJ SOSY
40.0000 mg | PREFILLED_SYRINGE | INTRAMUSCULAR | Status: DC
  Administered 2024-06-06: 40 mg via SUBCUTANEOUS
  Filled 2024-06-05: qty 0.4

## 2024-06-05 MED ORDER — MIDAZOLAM HCL 2 MG/2ML IJ SOLN
INTRAMUSCULAR | Status: AC
  Filled 2024-06-05: qty 2

## 2024-06-05 MED ORDER — ACETAMINOPHEN 10 MG/ML IV SOLN
1000.0000 mg | Freq: Once | INTRAVENOUS | Status: DC | PRN
  Administered 2024-06-05: 1000 mg via INTRAVENOUS

## 2024-06-05 MED ORDER — FENTANYL CITRATE (PF) 100 MCG/2ML IJ SOLN
25.0000 ug | INTRAMUSCULAR | Status: DC | PRN
  Administered 2024-06-05 (×3): 25 ug via INTRAVENOUS

## 2024-06-05 MED ORDER — OXYCODONE HCL 5 MG PO TABS: 5.0000 mg | ORAL_TABLET | Freq: Once | ORAL | Status: DC | PRN

## 2024-06-05 MED ORDER — ONDANSETRON HCL 4 MG/2ML IJ SOLN: 4.0000 mg | Freq: Once | INTRAMUSCULAR | Status: DC | PRN

## 2024-06-05 MED ORDER — CHLORHEXIDINE GLUCONATE 0.12 % MT SOLN: 15.0000 mL | Freq: Once | OROMUCOSAL | Status: AC

## 2024-06-05 MED ORDER — PHENYLEPHRINE 80 MCG/ML (10ML) SYRINGE FOR IV PUSH (FOR BLOOD PRESSURE SUPPORT)
PREFILLED_SYRINGE | INTRAVENOUS | Status: DC | PRN
  Administered 2024-06-05: 160 ug via INTRAVENOUS

## 2024-06-05 MED ORDER — FENTANYL CITRATE (PF) 250 MCG/5ML IJ SOLN
INTRAMUSCULAR | Status: DC | PRN
  Administered 2024-06-05: 50 ug via INTRAVENOUS
  Administered 2024-06-05: 100 ug via INTRAVENOUS

## 2024-06-05 MED ORDER — ORAL CARE MOUTH RINSE
15.0000 mL | Freq: Once | OROMUCOSAL | Status: AC
  Administered 2024-06-05: 15 mL via OROMUCOSAL

## 2024-06-05 MED ORDER — SPY AGENT GREEN - (INDOCYANINE FOR INJECTION)
1.2500 mg | Freq: Once | INTRAMUSCULAR | Status: AC
  Administered 2024-06-05: 1.25 mg via INTRAVENOUS

## 2024-06-05 MED ORDER — PROPOFOL 10 MG/ML IV BOLUS
INTRAVENOUS | Status: DC | PRN
  Administered 2024-06-05: 110 mg via INTRAVENOUS
  Administered 2024-06-05: 60 mg via INTRAVENOUS
  Administered 2024-06-05: 30 mg via INTRAVENOUS

## 2024-06-05 MED ORDER — INSULIN ASPART 100 UNIT/ML IJ SOLN: 0.0000 [IU] | INTRAMUSCULAR | Status: DC | PRN

## 2024-06-05 MED ORDER — ONDANSETRON HCL 4 MG/2ML IJ SOLN
INTRAMUSCULAR | Status: DC | PRN
  Administered 2024-06-05: 4 mg via INTRAVENOUS

## 2024-06-05 MED ORDER — ACETAMINOPHEN 10 MG/ML IV SOLN
INTRAVENOUS | Status: AC
  Filled 2024-06-05: qty 100

## 2024-06-05 MED ORDER — FENTANYL CITRATE (PF) 250 MCG/5ML IJ SOLN
INTRAMUSCULAR | Status: AC
  Filled 2024-06-05: qty 5

## 2024-06-05 MED ORDER — LACTATED RINGERS IV SOLN: INTRAVENOUS | Status: DC

## 2024-06-05 MED ORDER — BUPIVACAINE-EPINEPHRINE (PF) 0.25% -1:200000 IJ SOLN
INTRAMUSCULAR | Status: AC
  Filled 2024-06-05: qty 30

## 2024-06-05 MED ORDER — CHLORHEXIDINE GLUCONATE 0.12 % MT SOLN
OROMUCOSAL | Status: AC
  Filled 2024-06-05: qty 15

## 2024-06-05 MED ORDER — OXYCODONE HCL 5 MG/5ML PO SOLN: 5.0000 mg | Freq: Once | ORAL | Status: DC | PRN

## 2024-06-05 MED ORDER — DEXAMETHASONE SOD PHOSPHATE PF 10 MG/ML IJ SOLN
INTRAMUSCULAR | Status: DC | PRN
  Administered 2024-06-05: 5 mg via INTRAVENOUS

## 2024-06-05 MED ORDER — ROCURONIUM BROMIDE 10 MG/ML (PF) SYRINGE
PREFILLED_SYRINGE | INTRAVENOUS | Status: DC | PRN
  Administered 2024-06-05: 50 mg via INTRAVENOUS

## 2024-06-05 NOTE — Care Management Obs Status (Signed)
 MEDICARE OBSERVATION STATUS NOTIFICATION   Patient Details  Name: Russell Soto MRN: [id number] Date of Birth: 22-Jun-1950   Medicare Observation Status Notification Given:  Yes    Jennie Laneta Dragon 06/05/2024, 2:38 PM

## 2024-06-05 NOTE — Transfer of Care (Signed)
 Immediate Anesthesia Transfer of Care Note  Patient: Russell Soto  Procedure(s) Performed: LAPAROSCOPIC CHOLECYSTECTOMY INDOCYANINE GREEN  FLUORESCENCE IMAGING (ICG)  Patient Location: PACU  Anesthesia Type:General  Level of Consciousness: drowsy and patient cooperative  Airway & Oxygen Therapy: Patient Spontanous Breathing and Patient connected to nasal cannula oxygen  Post-op Assessment: Report given to RN and Post -op Vital signs reviewed and stable  Post vital signs: Reviewed and stable  Last Vitals:  Vitals Value Taken Time  BP 141/70 06/05/24 09:36  Temp    Pulse 78 06/05/24 09:40  Resp 14 06/05/24 09:40  SpO2 91 % 06/05/24 09:40  Vitals shown include unfiled device data.  Last Pain:  Vitals:   06/05/24 0717  TempSrc: (P) Oral  PainSc:          Complications: No notable events documented.

## 2024-06-05 NOTE — Op Note (Signed)
 Preoperative diagnosis: Acute cholecystitis Postoperative diagnosis: Gangrenous cholecystitis Procedure: Laparoscopic cholecystectomy Surgeon: Dr. Adina Bury Anesthesia: General Estimated blood loss: 100 cc Specimens: Gallbladder and contents to pathology Drains: None Complications: None Sponge needle count was correct completion Disposition recovery stable condition   Indications: This a 74 year old male with multiple medical problems who presents with right upper quadrant pain.  He had distended gb on imaging with a hida that was positive. We discussed lap chole.  Procedure: After informed consent was obtained he was taken to the operating room.  He was given antibiotics.  SCDs were in place.  He was placed under general anesthesia without complication.  He is prepped and draped in standard sterile surgical fashion.  Surgical timeout was then performed   I infiltrated Marcaine  below the umbilicus.  I made a vertical incision and grasped the fascia.  I incised the fascia sharply and entered the peritoneum bluntly without injury.  I then placed a 0 Vicryl pursestring suture through the fascia.  I inserted a Hassan trocar and insufflated the abdomen to 15 mmHg pressure.  I then inserted 3 additional 5 mm trocars in the epigastrium and right side of the abdomen without injury. I had to aspirate the gallbladder to grasp it. I also placed another five mm trocar to retract omentum.  Ithen was able to dissect the triangle and identify the critical view of safety. .  I did confirm this was correct with ICG dye.  I then clipped both the artery and duct and divided them.  Two clips were left in place. The duct was viable and the clips traversed the duct.  The gallbladder was then placed in a retrieval bag along with the stones and removed from the abdomen.  I then obtained hemostasis.  There were numerous areas on the liver that were bleeding.  I placed 2 pieces of Surgicel snow in the gallbladder fossa.   I then removed my Hassan trocar and tied my pursestring down.  I placed 2 additional 0 Vicryl sutures through the fascia using the suture passer device.  This completely obliterated the defect.  The trocars were then removed and the abdomen desufflated.  These were closed with 4-0 Monocryl and glue.  He tolerated this well was transferred recovery stable.

## 2024-06-05 NOTE — Interval H&P Note (Signed)
 History and Physical Interval Note:  06/05/2024 8:11 AM  Russell Soto  has presented today for surgery, with the diagnosis of Cholecystitis.  The various methods of treatment have been discussed with the patient and family. After consideration of risks, benefits and other options for treatment, the patient has consented to  Procedures: LAPAROSCOPIC CHOLECYSTECTOMY (N/A) INDOCYANINE GREEN  FLUORESCENCE IMAGING (ICG) (N/A) as a surgical intervention.  The patient's history has been reviewed, patient examined, no change in status, stable for surgery.  I have reviewed the patient's chart and labs.  Questions were answered to the patient's satisfaction.     [name]

## 2024-06-05 NOTE — Discharge Instructions (Signed)

## 2024-06-05 NOTE — Anesthesia Preprocedure Evaluation (Addendum)
"                                    Anesthesia Evaluation  Patient identified by MRN, date of birth, ID band Patient awake    Reviewed: Allergy & Precautions, NPO status , Patient's Chart, lab work & pertinent test results, reviewed documented beta blocker date and time   Airway Mallampati: II  TM Distance: >3 FB     Dental no notable dental hx.    Pulmonary neg COPD, former smoker   breath sounds clear to auscultation       Cardiovascular Exercise Tolerance: Good (-) hypertension(-) angina (-) CAD, (-) Past MI and (-) Cardiac Stents  Rhythm:Regular Rate:Normal     Neuro/Psych neg Seizures    GI/Hepatic ,,,(+) neg Cirrhosis      cholecystitis   Endo/Other  diabetes, Type 2    Renal/GU Renal disease     Musculoskeletal   Abdominal   Peds  Hematology   Anesthesia Other Findings   Reproductive/Obstetrics                              Anesthesia Physical Anesthesia Plan  ASA: 2  Anesthesia Plan: General   Post-op Pain Management:    Induction:   PONV Risk Score and Plan: 1 and Ondansetron  and Dexamethasone   Airway Management Planned: Oral ETT  Additional Equipment:   Intra-op Plan:   Post-operative Plan: Extubation in OR  Informed Consent: I have reviewed the patients History and Physical, chart, labs and discussed the procedure including the risks, benefits and alternatives for the proposed anesthesia with the patient or authorized representative who has indicated his/her understanding and acceptance.     Dental advisory given  Plan Discussed with: CRNA  Anesthesia Plan Comments:         Anesthesia Quick Evaluation  "

## 2024-06-05 NOTE — Anesthesia Procedure Notes (Signed)
 Procedure Name: Intubation Date/Time: 06/05/2024 8:24 AM  Performed by: Evette Ade, CRNAPre-anesthesia Checklist: Patient identified, Emergency Drugs available, Suction available, Patient being monitored and Timeout performed Patient Re-evaluated:Patient Re-evaluated prior to induction Oxygen Delivery Method: Circle system utilized Preoxygenation: Pre-oxygenation with 100% oxygen Induction Type: IV induction Ventilation: Mask ventilation without difficulty and Oral airway inserted - appropriate to patient size Laryngoscope Size: Glidescope and 4 Grade View: Grade III Tube type: Oral Tube size: 7.5 mm Number of attempts: 2 Airway Equipment and Method: Stylet and Video-laryngoscopy Placement Confirmation: ETT inserted through vocal cords under direct vision, positive ETCO2 and breath sounds checked- equal and bilateral Secured at: 22 cm Tube secured with: Tape Dental Injury: Teeth and Oropharynx as per pre-operative assessment  Difficulty Due To: Difficult Airway- due to reduced neck mobility Future Recommendations: Recommend- induction with short-acting agent, and alternative techniques readily available

## 2024-06-06 ENCOUNTER — Encounter (HOSPITAL_COMMUNITY): Payer: Self-pay | Admitting: General Surgery

## 2024-06-06 ENCOUNTER — Other Ambulatory Visit (HOSPITAL_COMMUNITY): Payer: Self-pay

## 2024-06-06 LAB — GLUCOSE, CAPILLARY: Glucose-Capillary: 179 mg/dL — ABNORMAL HIGH (ref 70–99)

## 2024-06-06 MED ORDER — AMOXICILLIN-POT CLAVULANATE 875-125 MG PO TABS
1.0000 | ORAL_TABLET | Freq: Two times a day (BID) | ORAL | 0 refills | Status: AC
  Filled 2024-06-06: qty 10, 5d supply, fill #0

## 2024-06-06 MED ORDER — OXYCODONE HCL 5 MG PO TABS
5.0000 mg | ORAL_TABLET | Freq: Four times a day (QID) | ORAL | 0 refills | Status: AC | PRN
  Filled 2024-06-06: qty 25, 7d supply, fill #0

## 2024-06-06 NOTE — Plan of Care (Signed)
   Problem: Education: Goal: Ability to describe self-care measures that may prevent or decrease complications (Diabetes Survival Skills Education) will improve Outcome: Progressing   Problem: Coping: Goal: Ability to adjust to condition or change in health will improve Outcome: Progressing   Problem: Fluid Volume: Goal: Ability to maintain a balanced intake and output will improve Outcome: Progressing

## 2024-06-06 NOTE — Progress Notes (Signed)
 Patient ID: Russell Soto, male   DOB: Sep 29, 1950, 74 y.o.   MRN: [id number]  Doing well this morning Wants to go home Abdomen soft, incisions clean  Plan: Discharge home

## 2024-06-06 NOTE — Discharge Summary (Signed)
 Physician Discharge Summary  Patient ID: Russell Soto MRN: [id number] DOB/AGE: Jul 22, 1950 74 y.o.  Admit date: 06/03/2024 Discharge date: 06/06/2024  Admission Diagnoses:  Discharge Diagnoses:  Principal Problem:   Acute cholecystitis   Discharged Condition: good  Hospital Course: Patient admitted with right upper quadrant abdominal pain.  He was found to have an elevated white blood count.  Ultrasound showed no gallstones.  White blood count was 16,000.  HIDA scan was positive for cholecystitis.  He was taken to the operating room on 1/30 where he underwent a laparoscopic cholecystectomy.  He tolerated this well.  On postop day 1 he was doing well.  He was tolerating a diet and ambulating well.  After discussion with the patient and his wife, the decision made to discharge patient home  Consults: None  Significant Diagnostic Studies:   Treatments: Laparoscopic cholecystectomy  Discharge Exam: Blood pressure (!) 159/73, pulse 83, temperature 98.6 F (37 C), resp. rate 18, height (P) 5' 8 (1.727 m), weight (P) 81.6 kg, SpO2 92%. General appearance: alert, cooperative, and no distress Resp: clear to auscultation bilaterally Cardio: regular rate and rhythm, S1, S2 normal, no murmur, click, rub or gallop Incision/Wound: abdomen soft, minimally tender, incisions clean  Disposition: Discharge disposition: 01-Home or Self Care        Allergies as of 06/06/2024   No Known Allergies      Medication List     TAKE these medications    amoxicillin -clavulanate 875-125 MG tablet Commonly known as: AUGMENTIN  Take 1 tablet by mouth 2 (two) times daily.   aspirin EC 325 MG tablet Take 325 mg by mouth daily.   fish oil-omega-3 fatty acids 1000 MG capsule Take 1 g by mouth daily.   FLAX SEED OIL PO Take 1 tablet by mouth daily.   metFORMIN 500 MG tablet Commonly known as: GLUCOPHAGE Take 1,000 mg by mouth in the morning and at bedtime.   multivitamin with minerals  tablet Take 1 tablet by mouth daily.   oxyCODONE  5 MG immediate release tablet Commonly known as: Oxy IR/ROXICODONE  Take 1 tablet (5 mg total) by mouth every 6 (six) hours as needed for moderate pain (pain score 4-6) or severe pain (pain score 7-10).   tamsulosin  0.4 MG Caps capsule Commonly known as: FLOMAX  Take 0.4 mg by mouth See admin instructions. Take 0.4 mg by mouth at dinner and then take 0.4 mg again at bedtime   valsartan -hydrochlorothiazide  320-25 MG tablet Commonly known as: DIOVAN -HCT Take 1 tablet by mouth daily.   VITAMIN B 12 PO Take 1 tablet by mouth daily.   Vitamin C 500 MG Caps Take 500 mg by mouth daily.        Follow-up Information     Maczis, Puja Gosai, PA-C Follow up on 06/25/2024.   Specialty: General Surgery Why: 2:45pm, Arrive 30 minutes prior to your appointment time, Please bring your insurance card and photo ID Contact information: [address] [facility] [address] [address] [facility] [place] [address] [phone]                 Signed: Vicenta Poli 06/06/2024, 8:09 AM

## 2024-06-08 LAB — SURGICAL PATHOLOGY
